# Patient Record
Sex: Male | Born: 1975
Health system: Southern US, Community
[De-identification: ages and names within clinical notes are randomized; demographics above are authoritative.]

## PROBLEM LIST (undated history)

## (undated) DIAGNOSIS — R51 Headache: Secondary | ICD-10-CM

## (undated) DIAGNOSIS — K429 Umbilical hernia without obstruction or gangrene: Secondary | ICD-10-CM

## (undated) DIAGNOSIS — F329 Major depressive disorder, single episode, unspecified: Secondary | ICD-10-CM

## (undated) DIAGNOSIS — Z87442 Personal history of urinary calculi: Secondary | ICD-10-CM

## (undated) DIAGNOSIS — F419 Anxiety disorder, unspecified: Secondary | ICD-10-CM

## (undated) DIAGNOSIS — R519 Headache, unspecified: Secondary | ICD-10-CM

## (undated) DIAGNOSIS — T4145XA Adverse effect of unspecified anesthetic, initial encounter: Secondary | ICD-10-CM

## (undated) DIAGNOSIS — D239 Other benign neoplasm of skin, unspecified: Secondary | ICD-10-CM

## (undated) DIAGNOSIS — K219 Gastro-esophageal reflux disease without esophagitis: Secondary | ICD-10-CM

## (undated) DIAGNOSIS — J189 Pneumonia, unspecified organism: Secondary | ICD-10-CM

## (undated) DIAGNOSIS — I10 Essential (primary) hypertension: Secondary | ICD-10-CM

## (undated) DIAGNOSIS — T8859XA Other complications of anesthesia, initial encounter: Secondary | ICD-10-CM

## (undated) DIAGNOSIS — F32A Depression, unspecified: Secondary | ICD-10-CM

## (undated) HISTORY — DX: Other benign neoplasm of skin, unspecified: D23.9

## (undated) HISTORY — DX: Gastro-esophageal reflux disease without esophagitis: K21.9

## (undated) HISTORY — DX: Anxiety disorder, unspecified: F41.9

## (undated) HISTORY — PX: TONSILLECTOMY: SUR1361

## (undated) HISTORY — PX: HERNIA REPAIR: SHX51

## (undated) HISTORY — DX: Umbilical hernia without obstruction or gangrene: K42.9

## (undated) HISTORY — DX: Essential (primary) hypertension: I10

## (undated) HISTORY — DX: Depression, unspecified: F32.A

## (undated) HISTORY — DX: Major depressive disorder, single episode, unspecified: F32.9

---

## 2007-04-23 ENCOUNTER — Emergency Department: Payer: Self-pay | Admitting: Emergency Medicine

## 2010-06-26 ENCOUNTER — Ambulatory Visit: Payer: Self-pay

## 2010-07-21 ENCOUNTER — Ambulatory Visit: Payer: Self-pay | Admitting: Orthopedic Surgery

## 2013-01-27 ENCOUNTER — Emergency Department: Payer: Self-pay | Admitting: Emergency Medicine

## 2013-01-27 LAB — CBC
HCT: 43.6 % (ref 40.0–52.0)
MCH: 30.6 pg (ref 26.0–34.0)
MCV: 89 fL (ref 80–100)
Platelet: 136 10*3/uL — ABNORMAL LOW (ref 150–440)
RBC: 4.91 10*6/uL (ref 4.40–5.90)
WBC: 9.1 10*3/uL (ref 3.8–10.6)

## 2013-01-27 LAB — COMPREHENSIVE METABOLIC PANEL
Anion Gap: 9 (ref 7–16)
BUN: 16 mg/dL (ref 7–18)
Calcium, Total: 8.1 mg/dL — ABNORMAL LOW (ref 8.5–10.1)
EGFR (Non-African Amer.): >60
Glucose: 128 mg/dL — ABNORMAL HIGH (ref 65–99)
Osmolality: 279 (ref 275–301)
Potassium: 3.4 mmol/L — ABNORMAL LOW (ref 3.5–5.1)
SGOT(AST): 111 U/L — ABNORMAL HIGH (ref 15–37)
SGPT (ALT): 83 U/L — ABNORMAL HIGH (ref 12–78)
Sodium: 138 mmol/L (ref 136–145)
Total Protein: 7.5 g/dL (ref 6.4–8.2)

## 2013-08-14 IMAGING — CT CT ABD-PELV W/ CM
1 of 2 series · 15 of 32 positions shown, 19 images · non-contrast
Comparison: none

REASON FOR EXAM: (1) epigastric pain, vomiting; (2) epigastric pain,
vomiting
COMMENTS:

[Series 2: 3mm soft tissue · axial · 0.79mm/px · z∈[-542,-60]mm · 15 of 177 slices shown, 19 images]
[im 8/177  soft-tissue]
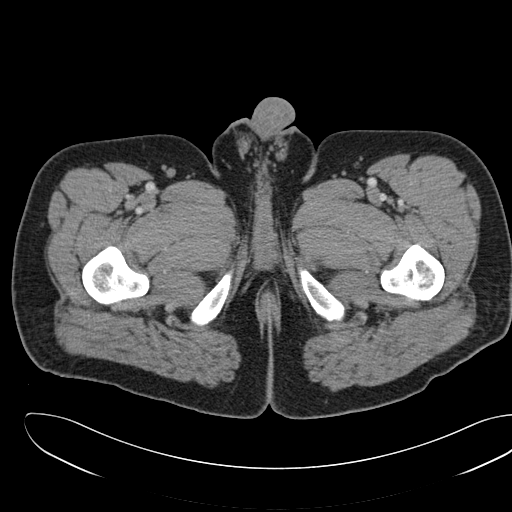
[im 8/177  bone]
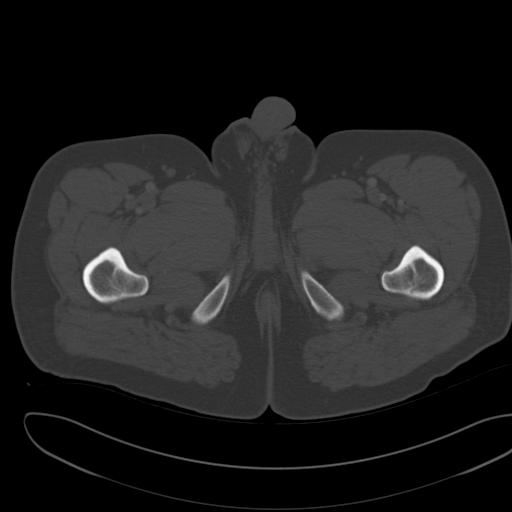
[im 23/177  soft-tissue]
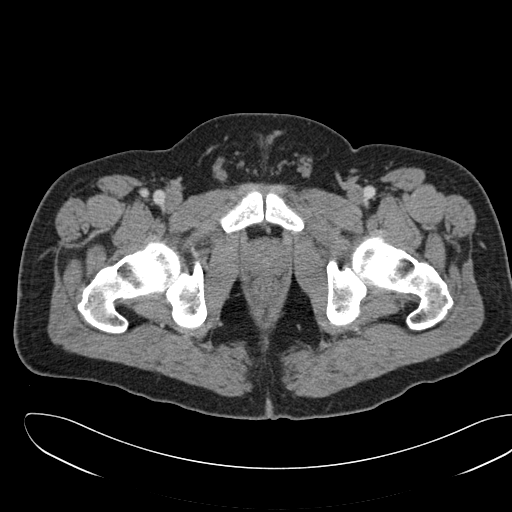
[im 37/177  soft-tissue]
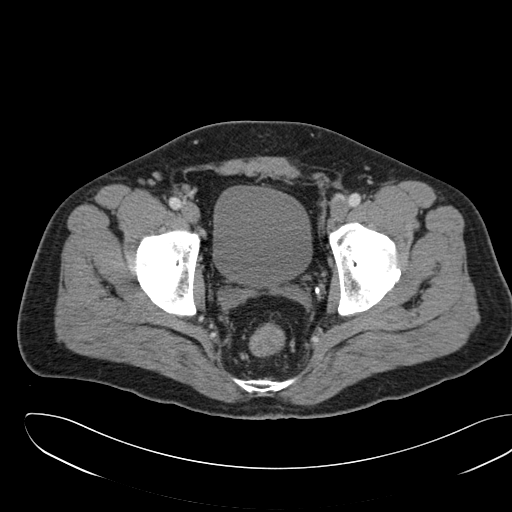
[im 52/177  soft-tissue]
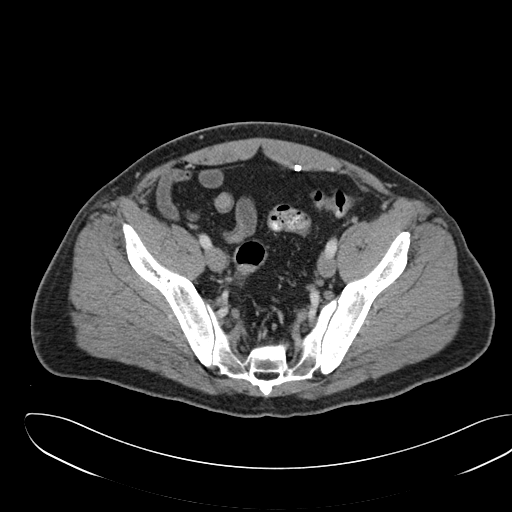
[im 59/177  soft-tissue]
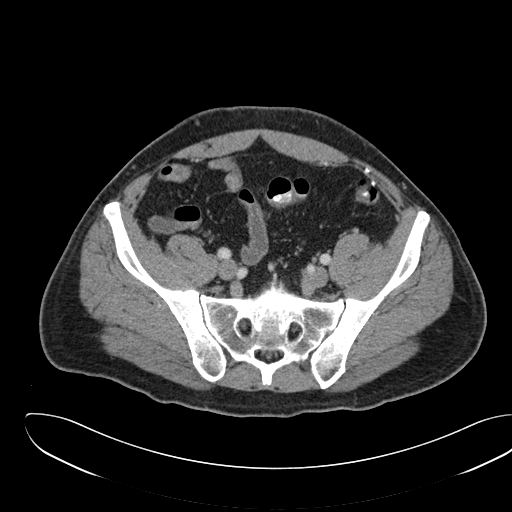
[im 74/177  soft-tissue]
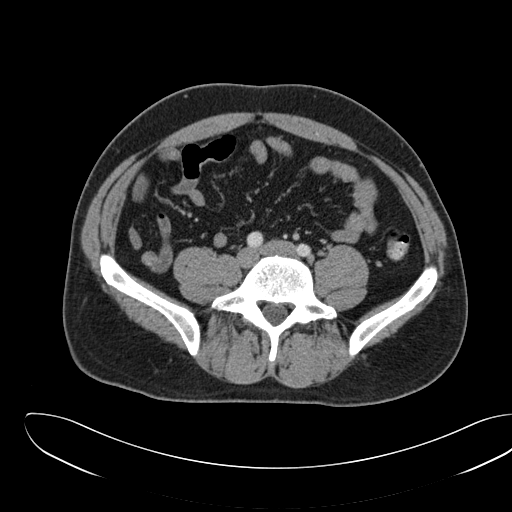
[im 89/177  soft-tissue]
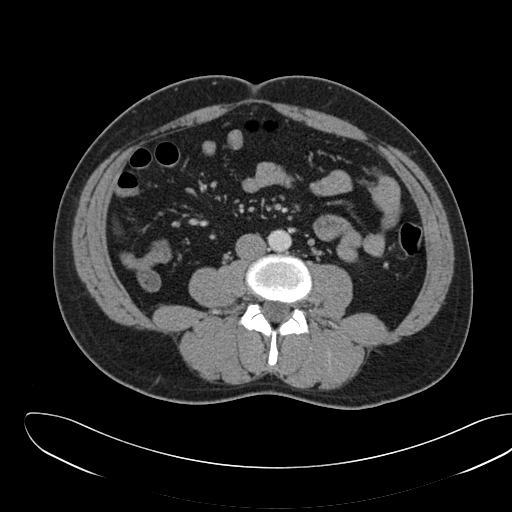
[im 103/177  soft-tissue]
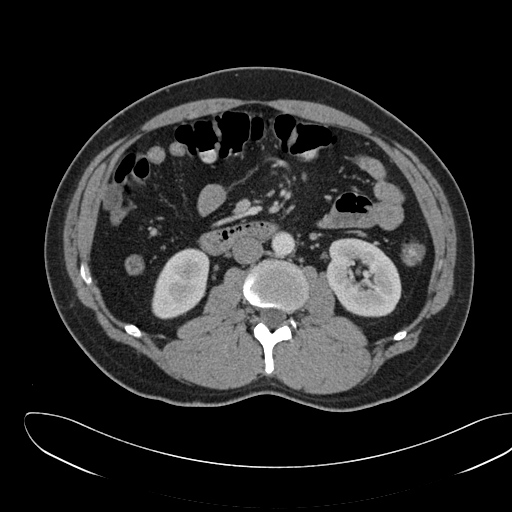
[im 118/177  soft-tissue]
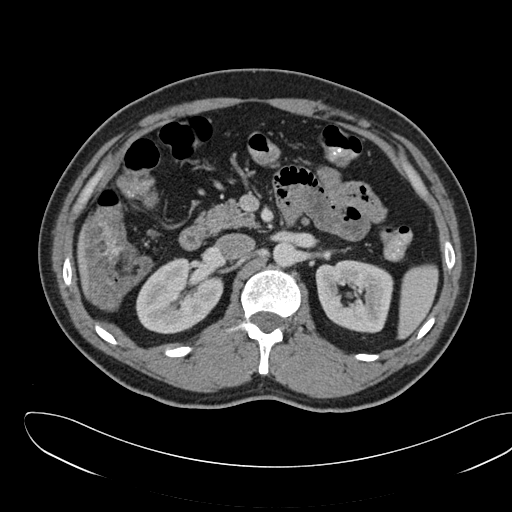
[im 118/177  bone]
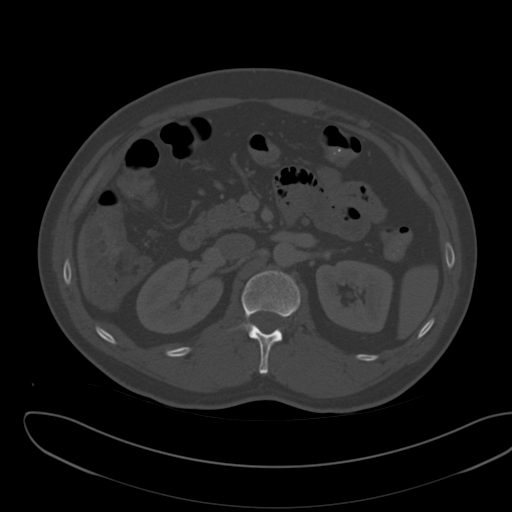
[im 125/177  soft-tissue]
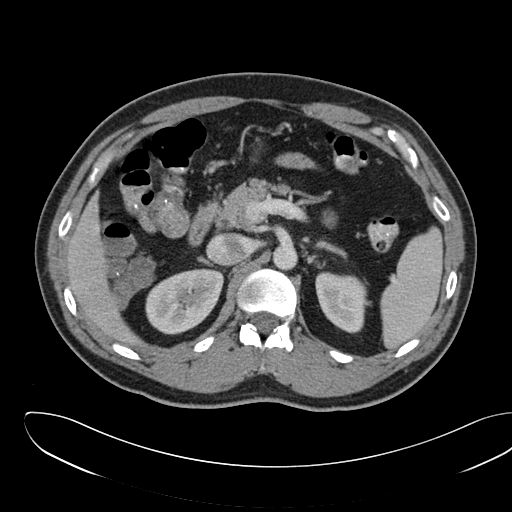
[im 140/177  soft-tissue]
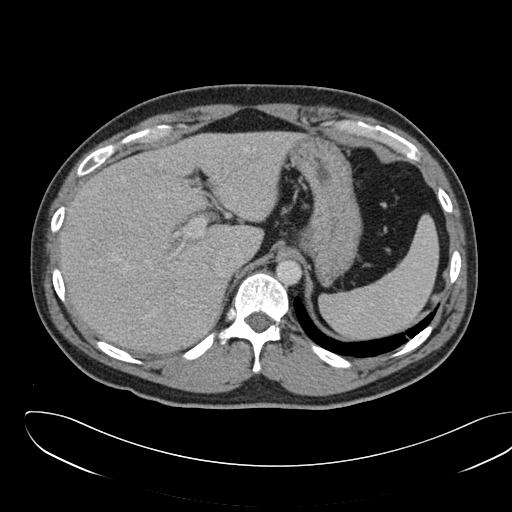
[im 147/177  lung]
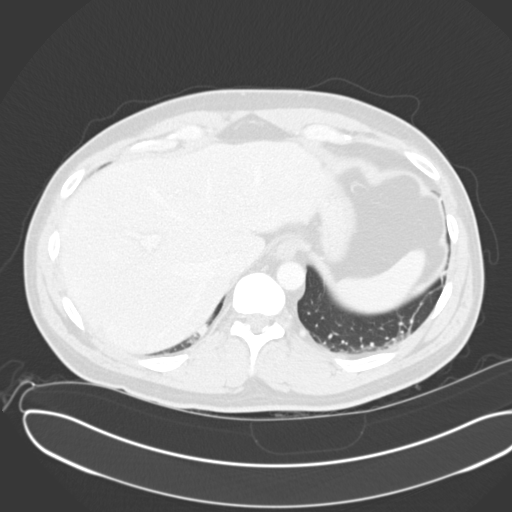
[im 155/177  soft-tissue]
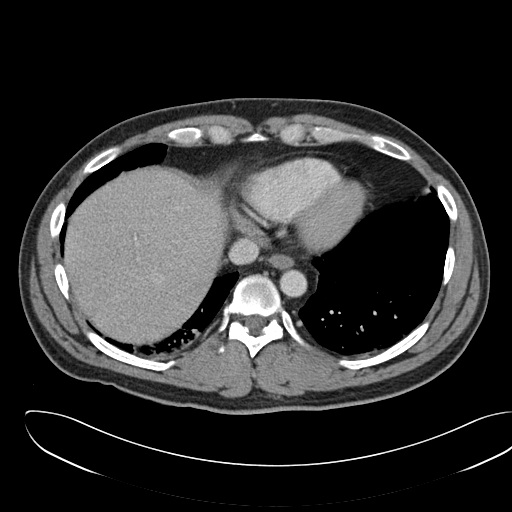
[im 155/177  lung]
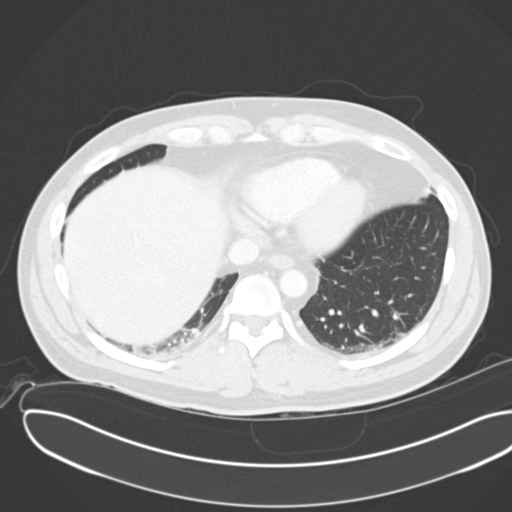
[im 162/177  lung]
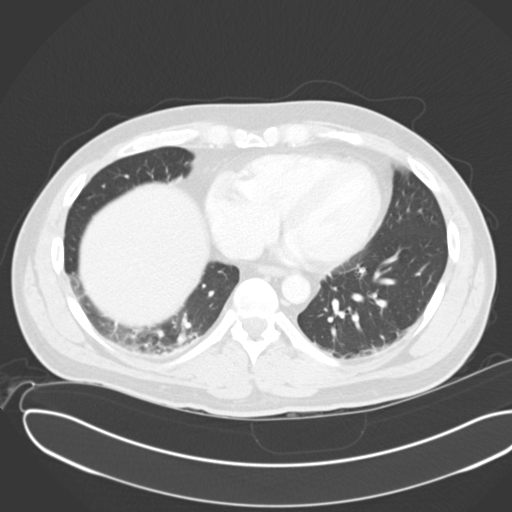
[im 169/177  soft-tissue]
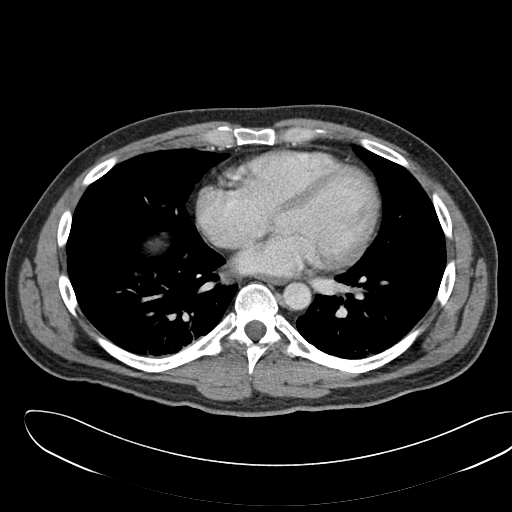
[im 169/177  lung]
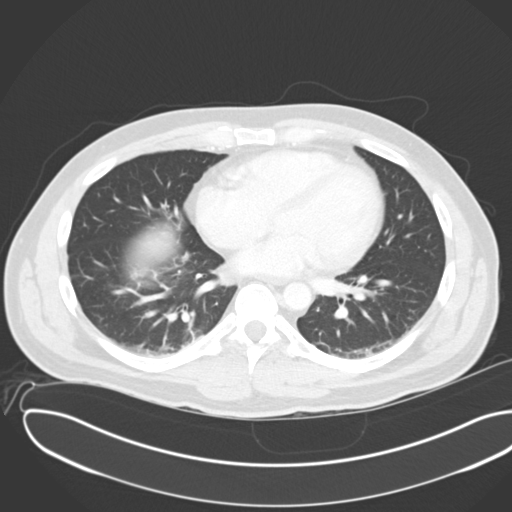

[15 of 32 positions shown; findings below may reference images not displayed]

PROCEDURE:     CT  - CT ABDOMEN / PELVIS  W  - January 28, 2013  [DATE]

RESULT:     Axial CT scanning was performed through the abdomen and pelvis
with reconstructions at 3 mm intervals and slice thicknesses following
intravenous administration of 100 cc of Isovue 300. Review of multiplanar
reconstructed images was performed separately on the VIA monitor.

The liver exhibits no focal mass. There is a trace of intrahepatic ductal
dilation. The gallbladder is contracted. The stomach is nondistended. The
pancreas, spleen, adrenal glands, and kidneys exhibit no acute
abnormalities. There is a 1.5 cm diameter accessory spleen on image 48. The
caliber of the abdominal aorta is normal. The periaortic and pericaval
regions are normal in appearance.

The unopacified loops of small and large bowel exhibit no evidence of ileus
nor obstruction. The right colon is not well distended. No definite
inflammatory changes are seen associated with it. The appendix is not
discretely identified but may lie on image 57. The rectosigmoid colon is
normal in appearance. The urinary bladder and prostate gland and seminal
vesicles are within the limits of normal. There is a small fat containing
right inguinal hernia. The patient has undergone previous left inguinal
hernia repair. There is no evidence of ascites.

There is atelectasis at the lung bases. The lumbar vertebral bodies are
preserved in height.
IMPRESSION: 1. There is no objective evidence of acute bowel abnormality.
2. No acute urinary tract abnormality is demonstrated.
3. The gallbladder is contracted and yet the stomach appeared to be empty.
There is also a trace of intrahepatic ductal dilation. These findings may be
physiologic but correlation with any symptoms that might indicate
gallbladder pathology be useful.
4. There is bibasilar atelectasis.

A preliminary report was sent to the [HOSPITAL] the conclusion
of the study.

The final report was discussed by me by telephone with Desi, RN, [REDACTED] charge nurse at [DATE] a.m. on 28 January, 2013.

[REDACTED]

## 2014-04-19 DIAGNOSIS — D239 Other benign neoplasm of skin, unspecified: Secondary | ICD-10-CM

## 2014-04-19 HISTORY — DX: Other benign neoplasm of skin, unspecified: D23.9

## 2014-07-13 ENCOUNTER — Emergency Department: Payer: Self-pay | Admitting: Emergency Medicine

## 2014-07-18 ENCOUNTER — Emergency Department: Payer: Self-pay | Admitting: Emergency Medicine

## 2015-01-16 ENCOUNTER — Emergency Department: Payer: Self-pay | Admitting: Physician Assistant

## 2015-06-06 ENCOUNTER — Other Ambulatory Visit: Payer: Self-pay | Admitting: Family Medicine

## 2015-08-02 IMAGING — CR DG RIBS 2V*R*
1 series · 3 of 3 positions shown · non-contrast
Comparison: None.

CLINICAL DATA: Cough since November 2014. New onset right upper
chest pain.

EXAM:
RIGHT RIBS - 2 VIEW

[Series 1: dxr ribs right unilateral · 0.14mm/px · 3 of 3 slices shown]
[im 1/3]
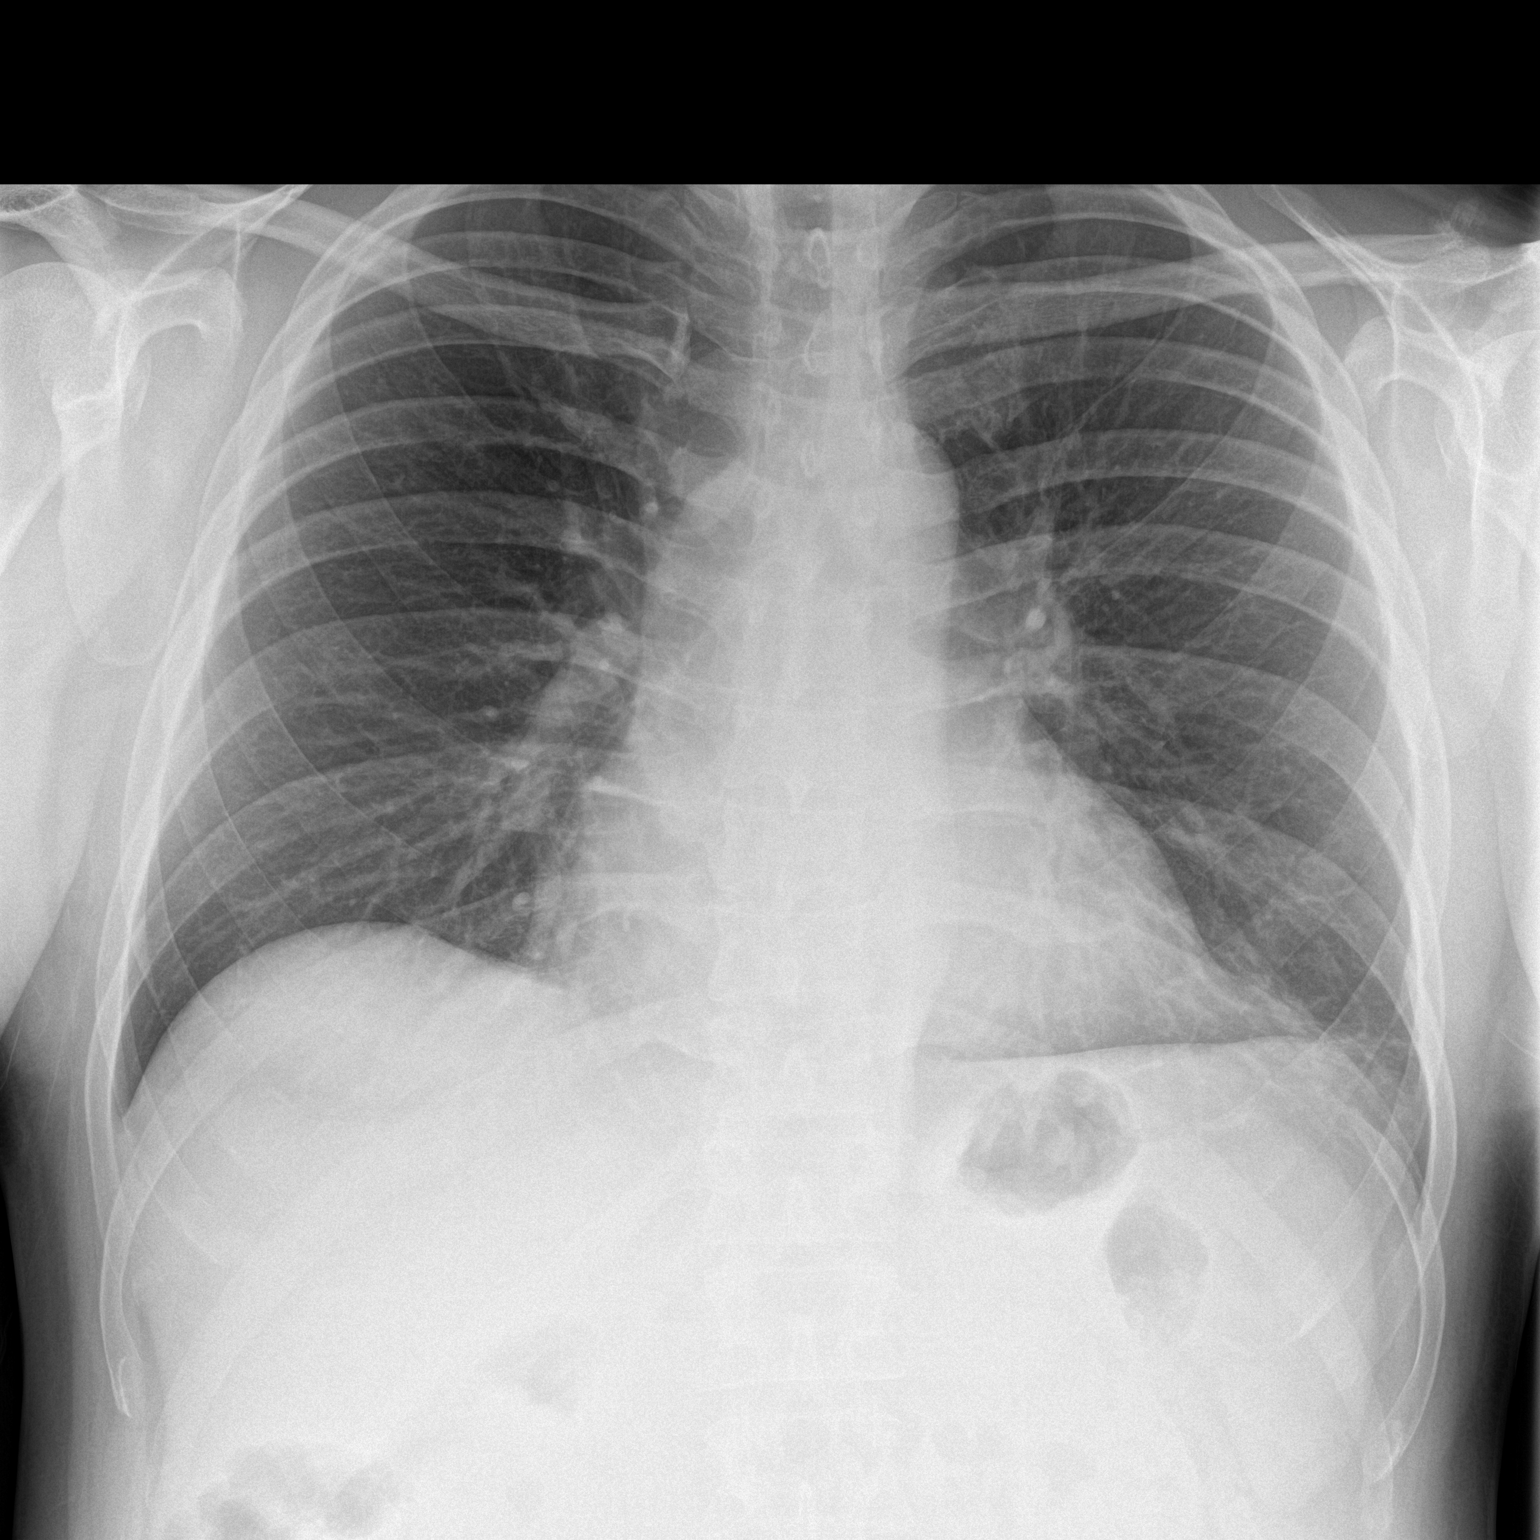
[im 2/3]
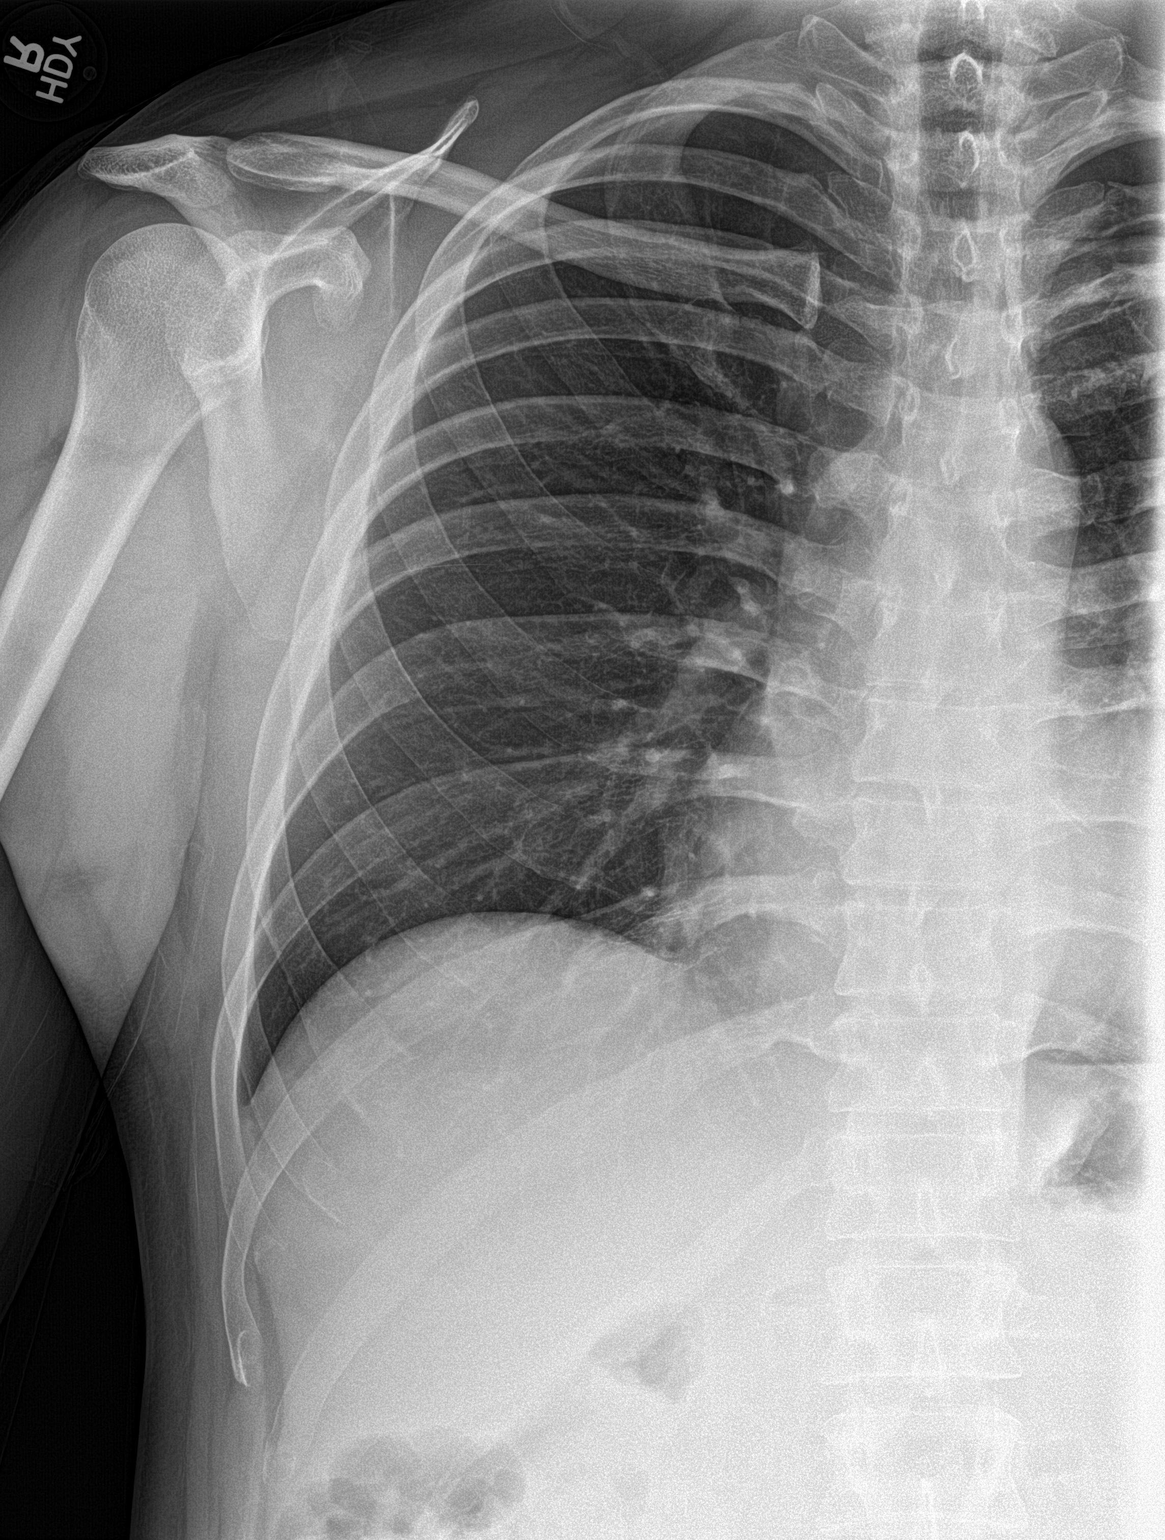
[im 3/3]
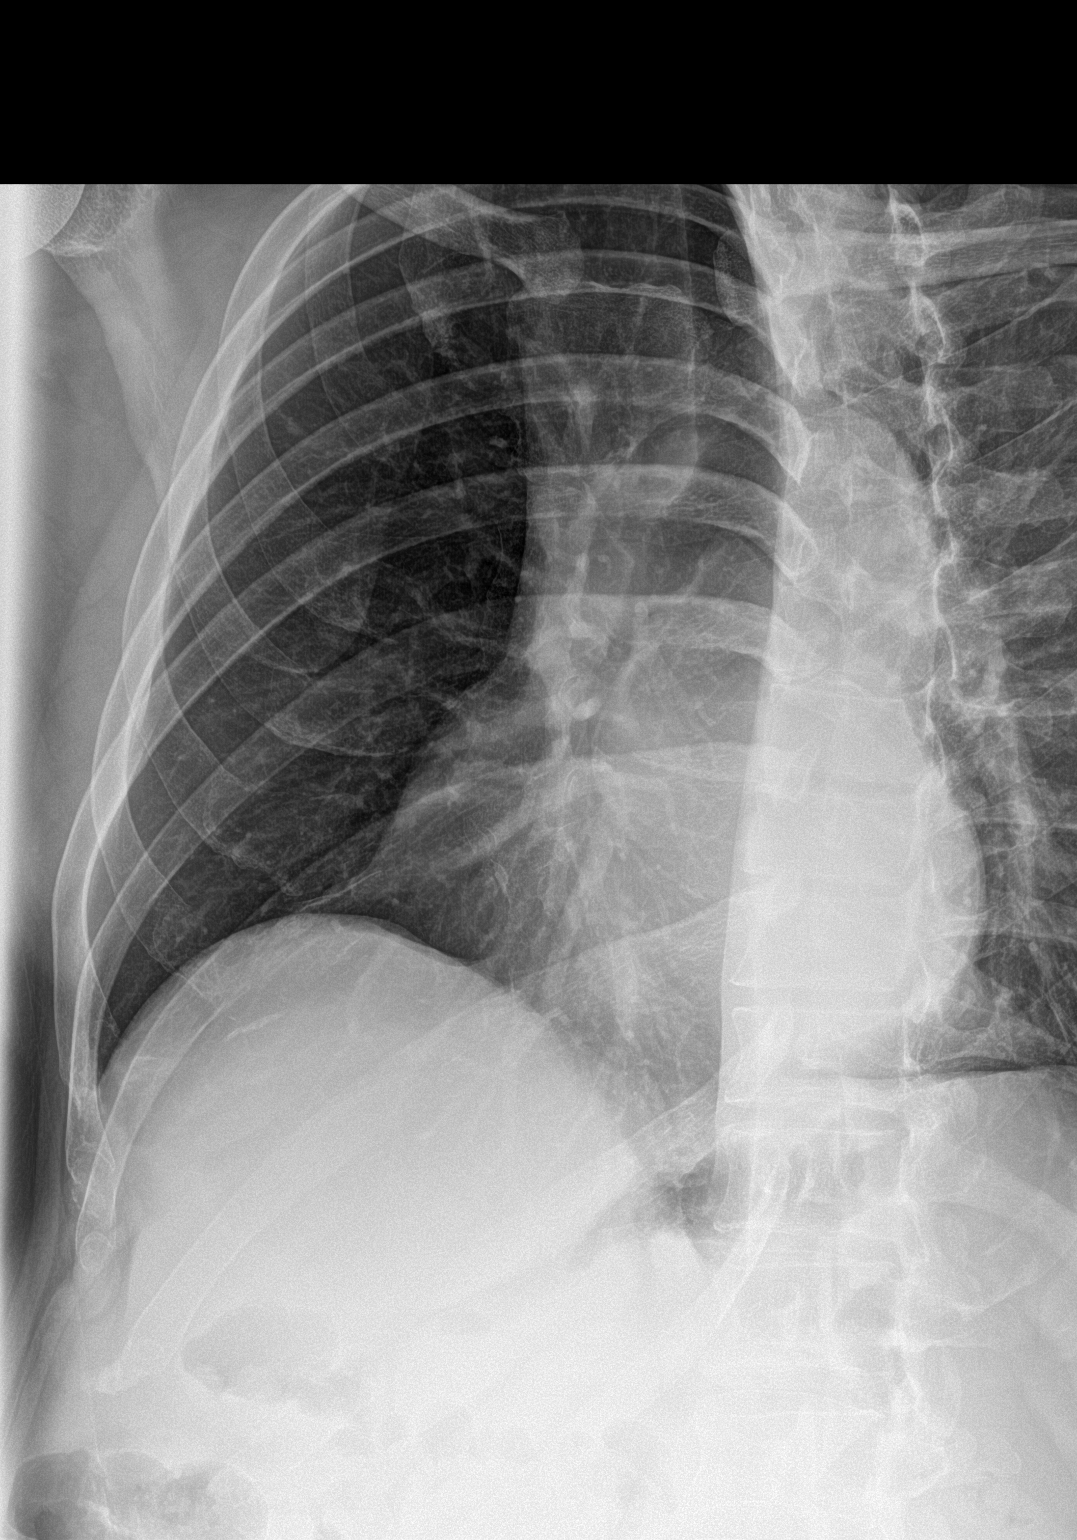

[3 of 3 positions shown; findings below may reference images not displayed]

FINDINGS: Minimal left basilar atelectasis is identified. The lungs are
otherwise clear. There is no pneumothorax or pleural effusion. Heart
size is normal. No fracture is identified.
IMPRESSION: Negative examination.

## 2016-01-22 ENCOUNTER — Ambulatory Visit
Admission: EM | Admit: 2016-01-22 | Discharge: 2016-01-22 | Disposition: A | Discharge status: Home / Self Care | Payer: Managed Care, Other (non HMO) | Attending: Family Medicine | Admitting: Family Medicine

## 2016-01-22 DIAGNOSIS — J029 Acute pharyngitis, unspecified: Secondary | ICD-10-CM

## 2016-01-22 LAB — RAPID STREP SCREEN (MED CTR MEBANE ONLY): STREPTOCOCCUS, GROUP A SCREEN (DIRECT): NEGATIVE

## 2016-01-22 MED ORDER — AMOXICILLIN-POT CLAVULANATE 875-125 MG PO TABS: 1.0000 | ORAL_TABLET | Freq: Two times a day (BID) | ORAL | Status: DC

## 2016-01-22 NOTE — ED Notes (Signed)
Patient complains of sore throat, body aches, sinus pain and pressure with drainage. Patient states that daughter currently has strep. Patient states that he feels very run down and fatigued.

## 2016-01-22 NOTE — ED Provider Notes (Signed)
CSN: [id number]     Arrival date & time 01/22/16  0915 History   First MD Initiated Contact with Patient 01/22/16 1021     Chief Complaint  Patient presents with  . Sore Throat   (Consider location/radiation/quality/duration/timing/severity/associated sxs/prior Treatment) HPI   This a 41 year old male who complains of sore throat and body aches in his pain and pressure with some drainage is had for several days. His throat has gotten more sore recently. Lites that his daughter and son recently were diagnosed with strep. Initially the rapid test was negative on both of them and then later they were notified that it did turn positive. He states he feels very fatigued.  History reviewed. No pertinent past medical history. Past Surgical History  Procedure Laterality Date  . No past surgeries     History reviewed. No pertinent family history. Social History  Substance Use Topics  . Smoking status: Never Smoker   . Smokeless tobacco: None  . Alcohol Use: No    Review of Systems  Constitutional: Positive for activity change and fatigue. Negative for fever and chills.  HENT: Positive for congestion, postnasal drip, rhinorrhea, sinus pressure and sore throat.   All other systems reviewed and are negative.   Allergies  Review of patient's allergies indicates no known allergies.  Home Medications   Prior to Admission medications   Medication Sig Start Date End Date Taking? Authorizing Provider  amoxicillin-clavulanate (AUGMENTIN) 875-125 MG tablet Take 1 tablet by mouth every 12 (twelve) hours. 01/22/16   Lorin Picket, PA-C  fluticasone (FLONASE) 50 MCG/ACT nasal spray INSTILL 2 SPRAYS IN EACH NOSTRIL DAILY 06/07/15   Megan Annia Friendly, DO   Meds Ordered and Administered this Visit  Medications - No data to display  BP 124/84 mmHg  Pulse 74  Temp(Src) 98.2 F (36.8 C) (Oral)  Resp 15  Ht 5' 11"  (1.803 m)  Wt 211 lb (95.709 kg)  BMI 29.44 kg/m2  SpO2 98% No data  found.   Physical Exam  Constitutional: He is oriented to person, place, and time. He appears well-developed and well-nourished. No distress.  HENT:  Head: Normocephalic and atraumatic.  Nose: Nose normal.  Mouth/Throat: Oropharynx is clear and moist. No oropharyngeal exudate.  TMs are dull  Eyes: Conjunctivae are normal. Pupils are equal, round, and reactive to light.  Neck: Normal range of motion. Neck supple. No thyromegaly present.  Pulmonary/Chest: Effort normal and breath sounds normal. No respiratory distress. He has no wheezes. He has no rales.  Musculoskeletal: Normal range of motion. He exhibits no edema or tenderness.  Lymphadenopathy:    He has no cervical adenopathy.  Neurological: He is alert and oriented to person, place, and time.  Skin: Skin is warm and dry. He is not diaphoretic.  Psychiatric: He has a normal mood and affect. His behavior is normal. Judgment and thought content normal.  Nursing note and vitals reviewed.   ED Course  Procedures (including critical care time)  Labs Review Labs Reviewed  RAPID STREP SCREEN (NOT AT [facility])  CULTURE, GROUP A STREP [facility])    Imaging Review No results found.   Visual Acuity Review  Right Eye Distance:   Left Eye Distance:   Bilateral Distance:    Right Eye Near:   Left Eye Near:    Bilateral Near:         MDM   1. Acute pharyngitis, unspecified pharyngitis type    New Prescriptions   AMOXICILLIN-CLAVULANATE (AUGMENTIN) 875-125 MG TABLET  Take 1 tablet by mouth every 12 (twelve) hours.  Plan: 1. Test/x-ray results and diagnosis reviewed with patient 2. rx as per orders; risks, benefits, potential side effects reviewed with patient 3. Recommend supportive treatment with continuous use of Flonase and that he pot. Although his rapid strep was negative he has 2 close contacts at home that had similar symptoms tested negative for the rapid stress and 2 days later converted to positive . For that reason,  I will treat him empirically on some Augmentin. He'll call in 2 days for confirmation of his strep test 4. F/u prn if symptoms worsen or don't improve     Lorin Picket, PA-C 01/22/16 1050

## 2016-01-22 NOTE — Discharge Instructions (Signed)
Pharyngitis Pharyngitis is redness, pain, and swelling (inflammation) of your pharynx.  CAUSES  Pharyngitis is usually caused by infection. Most of the time, these infections are from viruses (viral) and are part of a cold. However, sometimes pharyngitis is caused by bacteria (bacterial). Pharyngitis can also be caused by allergies. Viral pharyngitis may be spread from person to person by coughing, sneezing, and personal items or utensils (cups, forks, spoons, toothbrushes). Bacterial pharyngitis may be spread from person to person by more intimate contact, such as kissing.  SIGNS AND SYMPTOMS  Symptoms of pharyngitis include:   Sore throat.   Tiredness (fatigue).   Low-grade fever.   Headache.  Joint pain and muscle aches.  Skin rashes.  Swollen lymph nodes.  Plaque-like film on throat or tonsils (often seen with bacterial pharyngitis). DIAGNOSIS  Your health care provider will ask you questions about your illness and your symptoms. Your medical history, along with a physical exam, is often all that is needed to diagnose pharyngitis. Sometimes, a rapid strep test is done. Other lab tests may also be done, depending on the suspected cause.  TREATMENT  Viral pharyngitis will usually get better in 3-4 days without the use of medicine. Bacterial pharyngitis is treated with medicines that kill germs (antibiotics).  HOME CARE INSTRUCTIONS   Drink enough water and fluids to keep your urine clear or pale yellow.   Only take over-the-counter or prescription medicines as directed by your health care provider:   If you are prescribed antibiotics, make sure you finish them even if you start to feel better.   Do not take aspirin.   Get lots of rest.   Gargle with 8 oz of salt water ( tsp of salt per 1 qt of water) as often as every 1-2 hours to soothe your throat.   Throat lozenges (if you are not at risk for choking) or sprays may be used to soothe your throat. SEEK MEDICAL  CARE IF:   You have large, tender lumps in your neck.  You have a rash.  You cough up green, yellow-brown, or bloody spit. SEEK IMMEDIATE MEDICAL CARE IF:   Your neck becomes stiff.  You drool or are unable to swallow liquids.  You vomit or are unable to keep medicines or liquids down.  You have severe pain that does not go away with the use of recommended medicines.  You have trouble breathing (not caused by a stuffy nose). MAKE SURE YOU:   Understand these instructions.  Will watch your condition.  Will get help right away if you are not doing well or get worse.   This information is not intended to replace advice given to you by your health care provider. Make sure you discuss any questions you have with your health care provider.   Document Released: 12/17/2005 Document Revised: 10/07/2013 Document Reviewed: 08/24/2013 Elsevier Interactive Patient Education 2016 Elsevier Inc.  Rapid Strep Test Strep throat is a bacterial infection caused by the bacteria Streptococcus pyogenes. A rapid strep test is the quickest way to check if these bacteria are causing your sore throat. The test can be done at your health care provider's office. Results are usually ready in 10-20 minutes. You may have this test if you have symptoms of strep throat. These include:   A red throat with yellow or white spots.  Neck swelling and tenderness.  Fever.  Loss of appetite.  Trouble breathing or swallowing.  Rash.  Dehydration. This test requires a sample of fluid from the  back of your throat and tonsils. Your health care provider may hold down your tongue with a tongue depressor and use a swab to collect the sample.  Your health care provider may collect a second sample at the same time. The second sample may be used for a throat culture. In a culture test, the sample is combined with a substance that encourages bacteria to grow. It takes longer to get the results of the throat culture  test, but they are more accurate. They can confirm the results from a rapid strep test, or show that those results were wrong. RESULTS  It is your responsibility to obtain your test results. Ask the lab or department performing the test when and how you will get your results. Contact your health care provider to discuss any questions you have about your results.  The results of the rapid strep test will be negative or positive.  Meaning of Negative Test Results If the result of your rapid strep test is negative, then it means:   It is likely that you do not have strep throat.  A virus may be causing your sore throat. Your health care provider may do a throat culture to confirm the results of the rapid strep test. The throat culture can also identify the different strains of strep bacteria. Meaning of Positive Test Results If the result of your rapid strep test is positive, then it means:  It is likely that you do have strep throat.  You may have to take antibiotics. Your health care provider may do a throat culture to confirm the results of the rapid strep test. Strep throat usually requires a course of antibiotics.    This information is not intended to replace advice given to you by your health care provider. Make sure you discuss any questions you have with your health care provider.   Document Released: 01/24/2005 Document Revised: 01/07/2015 Document Reviewed: 03/25/2014 Elsevier Interactive Patient Education [employer].

## 2016-01-24 LAB — CULTURE, GROUP A STREP (THRC)

## 2016-03-07 ENCOUNTER — Encounter: Payer: Self-pay | Admitting: Family Medicine

## 2016-03-07 ENCOUNTER — Ambulatory Visit (INDEPENDENT_AMBULATORY_CARE_PROVIDER_SITE_OTHER): Payer: Managed Care, Other (non HMO) | Admitting: Family Medicine

## 2016-03-07 DIAGNOSIS — Z113 Encounter for screening for infections with a predominantly sexual mode of transmission: Secondary | ICD-10-CM | POA: Diagnosis not present

## 2016-03-07 DIAGNOSIS — F329 Major depressive disorder, single episode, unspecified: Secondary | ICD-10-CM | POA: Insufficient documentation

## 2016-03-07 DIAGNOSIS — F32A Depression, unspecified: Secondary | ICD-10-CM

## 2016-03-07 DIAGNOSIS — Z0000 Encounter for general adult medical examination without abnormal findings: Secondary | ICD-10-CM | POA: Diagnosis not present

## 2016-03-07 LAB — URINALYSIS, ROUTINE W REFLEX MICROSCOPIC
Bilirubin, UA: NEGATIVE
Glucose, UA: NEGATIVE
Ketones, UA: NEGATIVE
LEUKOCYTES UA: NEGATIVE
NITRITE UA: NEGATIVE
PH UA: 5.5 (ref 5.0–7.5)
PROTEIN UA: NEGATIVE
RBC, UA: NEGATIVE
SPEC GRAV UA: 1.02 (ref 1.005–1.030)
Urobilinogen, Ur: 0.2 mg/dL (ref 0.2–1.0)

## 2016-03-07 MED ORDER — RABEPRAZOLE SODIUM 20 MG PO TBEC: 20.0000 mg | DELAYED_RELEASE_TABLET | Freq: Every day | ORAL | Status: DC

## 2016-03-07 NOTE — Progress Notes (Signed)
   There were no vitals taken for this visit.   Subjective:    Patient ID: Martin Parks, male    DOB: [birth year], 41 y.o.   MRN: [id number]  HPI: Martin Parks is a 41 y.o. male  Chief Complaint  Patient presents with  . Annual Exam   patient follow-up doing well no complaints from medications taking Flonase without problems allergies doing well Fluoxetine doing good for nerves takes Xanax 1 or 2 a month for stormy days and otherwise doing well.   Relevant past medical, surgical, family and social history reviewed and updated as indicated. Interim medical history since our last visit reviewed. Allergies and medications reviewed and updated.  Review of Systems  Constitutional: Negative.   HENT: Negative.   Eyes: Negative.   Respiratory: Negative.   Cardiovascular: Negative.   Gastrointestinal: Negative.   Endocrine: Negative.   Genitourinary: Negative.   Musculoskeletal: Negative.   Skin: Negative.   Allergic/Immunologic: Negative.   Neurological: Negative.   Hematological: Negative.   Psychiatric/Behavioral: Negative.     Per HPI unless specifically indicated above     Objective:    There were no vitals taken for this visit.  Wt Readings from Last 3 Encounters:  01/27/15 209 lb (94.802 kg)  01/22/16 211 lb (95.709 kg)    Physical Exam  Constitutional: He is oriented to person, place, and time. He appears well-developed and well-nourished.  HENT:  Head: Normocephalic and atraumatic.  Right Ear: External ear normal.  Left Ear: External ear normal.  Eyes: Conjunctivae and EOM are normal. Pupils are equal, round, and reactive to light.  Neck: Normal range of motion. Neck supple.  Cardiovascular: Normal rate, regular rhythm, normal heart sounds and intact distal pulses.   Pulmonary/Chest: Effort normal and breath sounds normal.  Abdominal: Soft. Bowel sounds are normal. There is no splenomegaly or hepatomegaly.  Genitourinary: Rectum normal, prostate normal  and penis normal.  Musculoskeletal: Normal range of motion.  Neurological: He is alert and oriented to person, place, and time. He has normal reflexes.  Skin: No rash noted. No erythema.  Psychiatric: He has a normal mood and affect. His behavior is normal. Judgment and thought content normal.        Assessment & Plan:   Problem List Items Addressed This Visit      Other   Depression    Depression anxiety stable discuss again limitations of Xanax and cautions      Relevant Medications   ALPRAZolam (XANAX) 0.5 MG tablet    Other Visit Diagnoses    Routine general medical examination at a health care facility    -  Primary    Relevant Orders    CBC with Differential/Platelet    Comprehensive metabolic panel    Lipid Panel w/o Chol/HDL Ratio    PSA    Urinalysis, Routine w reflex microscopic (not at [facility])    TSH    Routine screening for STI (sexually transmitted infection)        Relevant Orders    HIV antibody        Follow up plan: Return in about 6 months (around 09/07/2016) for Med check.

## 2016-03-07 NOTE — Assessment & Plan Note (Signed)
Depression anxiety stable discuss again limitations of Xanax and cautions

## 2016-03-08 ENCOUNTER — Encounter: Payer: Self-pay | Admitting: Family Medicine

## 2016-03-08 LAB — COMPREHENSIVE METABOLIC PANEL
ALBUMIN: 4.3 g/dL (ref 3.5–5.5)
ALT: 33 IU/L (ref 0–44)
AST: 16 IU/L (ref 0–40)
Albumin/Globulin Ratio: 1.7 (ref 1.1–2.5)
Alkaline Phosphatase: 77 IU/L (ref 39–117)
BUN / CREAT RATIO: 16 (ref 9–20)
BUN: 12 mg/dL (ref 6–24)
Bilirubin Total: 0.2 mg/dL (ref 0.0–1.2)
CALCIUM: 8.9 mg/dL (ref 8.7–10.2)
CO2: 23 mmol/L (ref 18–29)
CREATININE: 0.76 mg/dL (ref 0.76–1.27)
Chloride: 101 mmol/L (ref 96–106)
GFR, EST AFRICAN AMERICAN: 132 mL/min/{1.73_m2} (ref >59)
GFR, EST NON AFRICAN AMERICAN: 114 mL/min/{1.73_m2} (ref >59)
GLUCOSE: 80 mg/dL (ref 65–99)
Globulin, Total: 2.5 g/dL (ref 1.5–4.5)
Potassium: 3.8 mmol/L (ref 3.5–5.2)
Sodium: 140 mmol/L (ref 134–144)
TOTAL PROTEIN: 6.8 g/dL (ref 6.0–8.5)

## 2016-03-08 LAB — LIPID PANEL W/O CHOL/HDL RATIO
Cholesterol, Total: 226 mg/dL — ABNORMAL HIGH (ref 100–199)
HDL: 36 mg/dL — AB (ref >39)
LDL CALC: 119 mg/dL — AB (ref 0–99)
Triglycerides: 353 mg/dL — ABNORMAL HIGH (ref 0–149)
VLDL CHOLESTEROL CAL: 71 mg/dL — AB (ref 5–40)

## 2016-03-08 LAB — CBC WITH DIFFERENTIAL/PLATELET
BASOS ABS: 0 10*3/uL (ref 0.0–0.2)
Basos: 1 %
EOS (ABSOLUTE): 0.1 10*3/uL (ref 0.0–0.4)
EOS: 3 %
HEMOGLOBIN: 14.1 g/dL (ref 12.6–17.7)
Hematocrit: 41.9 % (ref 37.5–51.0)
IMMATURE GRANS (ABS): 0 10*3/uL (ref 0.0–0.1)
IMMATURE GRANULOCYTES: 0 %
Lymphocytes Absolute: 2 10*3/uL (ref 0.7–3.1)
Lymphs: 37 %
MCH: 30.3 pg (ref 26.6–33.0)
MCHC: 33.7 g/dL (ref 31.5–35.7)
MCV: 90 fL (ref 79–97)
MONOCYTES: 9 %
Monocytes Absolute: 0.5 10*3/uL (ref 0.1–0.9)
NEUTROS ABS: 2.8 10*3/uL (ref 1.4–7.0)
NEUTROS PCT: 50 %
Platelets: 191 10*3/uL (ref 150–379)
RBC: 4.65 x10E6/uL (ref 4.14–5.80)
RDW: 13.7 % (ref 12.3–15.4)
WBC: 5.5 10*3/uL (ref 3.4–10.8)

## 2016-03-08 LAB — PSA: Prostate Specific Ag, Serum: 0.3 ng/mL (ref 0.0–4.0)

## 2016-03-08 LAB — HIV ANTIBODY (ROUTINE TESTING W REFLEX): HIV SCREEN 4TH GENERATION: NONREACTIVE

## 2016-03-08 LAB — TSH: TSH: 1.68 u[IU]/mL (ref 0.450–4.500)

## 2016-03-08 NOTE — Addendum Note (Signed)
Addended by: Rowe Clack H on: 03/08/2016 01:14 PM   Modules accepted: Miquel Dunn

## 2016-06-14 ENCOUNTER — Other Ambulatory Visit: Payer: Self-pay | Admitting: Family Medicine

## 2017-04-21 ENCOUNTER — Emergency Department
Admission: EM | Admit: 2017-04-21 | Discharge: 2017-04-21 | Disposition: A | Discharge status: Home / Self Care | Payer: Commercial Managed Care - PPO | Attending: Emergency Medicine | Admitting: Emergency Medicine

## 2017-04-21 DIAGNOSIS — Y939 Activity, unspecified: Secondary | ICD-10-CM | POA: Insufficient documentation

## 2017-04-21 DIAGNOSIS — S60351A Superficial foreign body of right thumb, initial encounter: Secondary | ICD-10-CM | POA: Insufficient documentation

## 2017-04-21 DIAGNOSIS — Z79899 Other long term (current) drug therapy: Secondary | ICD-10-CM | POA: Diagnosis not present

## 2017-04-21 DIAGNOSIS — I10 Essential (primary) hypertension: Secondary | ICD-10-CM | POA: Insufficient documentation

## 2017-04-21 DIAGNOSIS — Y929 Unspecified place or not applicable: Secondary | ICD-10-CM | POA: Insufficient documentation

## 2017-04-21 DIAGNOSIS — W450XXA Nail entering through skin, initial encounter: Secondary | ICD-10-CM | POA: Insufficient documentation

## 2017-04-21 DIAGNOSIS — Y999 Unspecified external cause status: Secondary | ICD-10-CM | POA: Insufficient documentation

## 2017-04-21 DIAGNOSIS — S6991XA Unspecified injury of right wrist, hand and finger(s), initial encounter: Secondary | ICD-10-CM | POA: Diagnosis present

## 2017-04-21 MED ORDER — LIDOCAINE-EPINEPHRINE-TETRACAINE (LET) SOLUTION: 3.0000 mL | Freq: Once | NASAL | Status: DC

## 2017-04-21 MED ORDER — CEPHALEXIN 500 MG PO CAPS: 500.0000 mg | ORAL_CAPSULE | Freq: Four times a day (QID) | ORAL | 0 refills | Status: AC

## 2017-04-21 MED ORDER — LIDOCAINE HCL (PF) 1 % IJ SOLN
5.0000 mL | Freq: Once | INTRAMUSCULAR | Status: AC
  Administered 2017-04-21: 5 mL via INTRADERMAL

## 2017-04-21 NOTE — ED Notes (Signed)
See triage note   States he is concerned of infection to thumb  Was unable to remove F B himself

## 2017-04-21 NOTE — ED Provider Notes (Signed)
[facility] Emergency Department Provider Note  ____________________________________________  Time seen: Approximately 11:26 AM  I have reviewed the triage vital signs and the nursing notes.   HISTORY  Chief Complaint Right Thumb Pain    HPI Martin Parks is a 42 y.o. male that presents to the emergency department with a "greenbrier" under right thumbnail. Patient states that he got a thorn caught under his thumbnail and has tried to get it out himself but has not been able to. He is going to [place] for work next week and is concerned that he will get an infection. He denies fever, shortness of breath, chest pain, nausea, vomiting, abdominal pain, drainage from nail.   Past Medical History:  Diagnosis Date  . Anxiety   . Depression   . GERD (gastroesophageal reflux disease)   . Hypertension     Patient Active Problem List   Diagnosis Date Noted  . Depression 03/07/2016    Past Surgical History:  Procedure Laterality Date  . HERNIA REPAIR    . NO PAST SURGERIES    . TONSILLECTOMY      Prior to Admission medications   Medication Sig Start Date End Date Taking? Authorizing Provider  ALPRAZolam Duanne Moron) 0.5 MG tablet 0.5 mg daily as needed. 02/02/16   Historical Provider, MD  cephALEXin (KEFLEX) 500 MG capsule Take 1 capsule (500 mg total) by mouth 4 (four) times daily. 04/21/17 05/01/17  Laban Emperor, PA-C  FLUoxetine (PROZAC) 20 MG capsule TK 3 CS PO QD 01/18/16   Historical Provider, MD  fluticasone (FLONASE) 50 MCG/ACT nasal spray SHAKE WELL AND USE 2 SPRAYS IN EACH NOSTRIL DAILY 06/14/16   Megan P Johnson, DO  RABEprazole (ACIPHEX) 20 MG tablet Take 1 tablet (20 mg total) by mouth daily. 03/07/16   Guadalupe Maple, MD    Allergies Patient has no known allergies.  Family History  Problem Relation Age of Onset  . Cancer Sister     breast    Social History Social History  Substance Use Topics  . Smoking status: Never Smoker  . Smokeless  tobacco: Never Used  . Alcohol use 0.0 oz/week     Comment: occasional     Review of Systems  Constitutional: No fever/chills ENT: No upper respiratory complaints. Cardiovascular: No chest pain. Respiratory: No cough. No SOB. Gastrointestinal: No abdominal pain.  No nausea, no vomiting.  Musculoskeletal: Positive for thumb pain. Skin: Negative for rash, abrasions, lacerations, ecchymosis. Neurological: Negative for headaches, numbness or tingling   ____________________________________________   PHYSICAL EXAM:  VITAL SIGNS: ED Triage Vitals  Enc Vitals Group     BP 04/21/17 1013 (!) 153/103     Pulse Rate 04/21/17 1013 66     Resp 04/21/17 1013 18     Temp 04/21/17 1013 98 F (36.7 C)     Temp Source 04/21/17 1013 Oral     SpO2 04/21/17 1013 97 %     Weight 04/21/17 1013 220 lb (99.8 kg)     Height 04/21/17 1013 5' 11"  (1.803 m)     Head Circumference --      Peak Flow --      Pain Score 04/21/17 1009 5     Pain Loc --      Pain Edu? --      Excl. in [place]? --      Constitutional: Alert and oriented. Well appearing and in no acute distress. Eyes: Conjunctivae are normal. PERRL. EOMI. Head: Atraumatic. ENT:  Ears:      Nose: No congestion/rhinnorhea.      Mouth/Throat: Mucous membranes are moist.  Neck: No stridor.  Cardiovascular: Normal rate, regular rhythm.  Good peripheral circulation. Respiratory: Normal respiratory effort without tachypnea or retractions. Lungs CTAB. Good air entry to the bases with no decreased or absent breath sounds. Musculoskeletal: Full range of motion to all extremities. No gross deformities appreciated. Neurologic:  Normal speech and language. No gross focal neurologic deficits are appreciated.  Skin:  Skin is warm, dry and intact. No rash noted. 25m thorn under right thumbnail near cuticle. Finger tender to palpation. No erythema. No swelling.    ____________________________________________   LABS (all labs ordered are  listed, but only abnormal results are displayed)  Labs Reviewed - No data to display ____________________________________________  EKG   ____________________________________________  RADIOLOGY  No results found.  ____________________________________________    PROCEDURES  Procedure(s) performed:    Procedures  Hole was drilled in right thumbnail with 18-gauge needle. We attempted to numb the cuticle with topical lidocaine but patient requested that the entire finger be numbed with lidocaine. Thorn was removed from nail.   Medications  lidocaine (PF) (XYLOCAINE) 1 % injection 5 mL (5 mLs Intradermal Given by Other 04/21/17 1206)     ____________________________________________   INITIAL IMPRESSION / ASSESSMENT AND PLAN / ED COURSE  Pertinent labs & imaging results that were available during my care of the patient were reviewed by me and considered in my medical decision making (see chart for details).  Review of the [place] CSRS was performed in [place] of the [place] any [place] drugs.     Patient's diagnosis is consistent with foreign body under fingernail. Vital signs and exam are reassuring. Foreign body was removed by drilling a hole in the fingernail with 18-gauge needle. I do not anticipate infection but [place] will be given a prescription for Keflex that he will not start unless he shows signs of infection. Patient will be discharged home with prescriptions for Keflex. Patient is to follow up with PCP as directed. Patient is given ED precautions to return to the ED for any worsening or new symptoms.     ____________________________________________  FINAL CLINICAL IMPRESSION(S) / ED DIAGNOSES  Final diagnoses:  Injury by nail, initial encounter      NEW MEDICATIONS STARTED DURING THIS VISIT:  Discharge Medication List as of 04/21/2017 11:59 AM    START taking these medications   Details   cephALEXin (KEFLEX) 500 MG capsule Take 1 capsule (500 mg total) by mouth 4 (four) times daily., Starting Sun 04/21/2017, Until Wed 05/01/2017, Print            This chart was dictated using voice recognition software/Dragon. Despite best efforts to proofread, errors can occur which can change the meaning. Any change was purely unintentional.    ALaban Emperor PA-C 04/21/17 [place] MD 04/21/17 [id number]

## 2017-04-21 NOTE — ED Triage Notes (Signed)
Pt states he got a greenbrier in his right thumb and pain is worsening since last night.  Pt states he is going to [place] soon and would like it removed before he goes.

## 2018-09-25 LAB — TSH: TSH: 1.65 (ref 0.41–5.90)

## 2018-09-25 LAB — HEPATIC FUNCTION PANEL
ALT: 35 (ref 10–40)
AST: 19 (ref 14–40)

## 2018-09-25 LAB — CBC AND DIFFERENTIAL
HCT: 43 (ref 41–53)
Hemoglobin: 14.6 (ref 13.5–17.5)
WBC: 5.3

## 2018-09-25 LAB — LIPID PANEL
Cholesterol: 212 — AB (ref 0–200)
HDL: 45 (ref 35–70)
LDL Cholesterol: 144
LDl/HDL Ratio: 4.7
Triglycerides: 116 (ref 40–160)

## 2018-09-25 LAB — BASIC METABOLIC PANEL
BUN: 11 (ref 4–21)
Creatinine: 0.8 (ref 0.6–1.3)
Glucose: 98
Potassium: 4.2 (ref 3.4–5.3)
Sodium: 142 (ref 137–147)

## 2018-09-25 LAB — IRON,TIBC AND FERRITIN PANEL: Iron: 112

## 2018-09-25 LAB — PSA: PSA: 0.4

## 2018-12-22 ENCOUNTER — Ambulatory Visit: Payer: Managed Care, Other (non HMO) | Admitting: Family Medicine

## 2018-12-22 ENCOUNTER — Encounter: Payer: Self-pay | Admitting: Family Medicine

## 2018-12-22 VITALS — BP 134/82 | HR 89 | Temp 98.3°F | Resp 16 | Ht 70.25 in | Wt 232.3 lb

## 2018-12-22 DIAGNOSIS — F909 Attention-deficit hyperactivity disorder, unspecified type: Secondary | ICD-10-CM | POA: Insufficient documentation

## 2018-12-22 DIAGNOSIS — F411 Generalized anxiety disorder: Secondary | ICD-10-CM

## 2018-12-22 DIAGNOSIS — E669 Obesity, unspecified: Secondary | ICD-10-CM

## 2018-12-22 DIAGNOSIS — K429 Umbilical hernia without obstruction or gangrene: Secondary | ICD-10-CM

## 2018-12-22 DIAGNOSIS — R635 Abnormal weight gain: Secondary | ICD-10-CM | POA: Diagnosis not present

## 2018-12-22 DIAGNOSIS — E785 Hyperlipidemia, unspecified: Secondary | ICD-10-CM

## 2018-12-22 DIAGNOSIS — Z0000 Encounter for general adult medical examination without abnormal findings: Secondary | ICD-10-CM | POA: Diagnosis not present

## 2018-12-22 DIAGNOSIS — Z3009 Encounter for other general counseling and advice on contraception: Secondary | ICD-10-CM

## 2018-12-22 HISTORY — DX: Umbilical hernia without obstruction or gangrene: K42.9

## 2018-12-22 NOTE — Assessment & Plan Note (Signed)
Encouraged 64 ounces of water a day; limit sugary drinks; try to gradually increase activity

## 2018-12-22 NOTE — Assessment & Plan Note (Signed)
No signs of obstruction; refer to surgeon; in the meantime, to ER if s/s of gangrene, obstruction

## 2018-12-22 NOTE — Patient Instructions (Addendum)
Check out the information at familydoctor.org entitled "Nutrition for Weight Loss: What You Need to Know about Fad Diets" Try to lose between 1-2 pounds per week by taking in fewer calories and burning off more calories You can succeed by limiting portions, limiting foods dense in calories and fat, becoming more active, and drinking 8 glasses of water a day (64 ounces) Don't skip meals, especially breakfast, as skipping meals may alter your metabolism Do not use over-the-counter weight loss pills or gimmicks that claim rapid weight loss A healthy BMI (or body mass index) is between 18.5 and 24.9 You can calculate your ideal BMI at the [place] website ClubMonetize.fr  Try to follow the DASH guidelines (DASH stands for Dietary Approaches to Stop Hypertension). Try to limit the sodium in your diet to no more than 1,534m of sodium per day. Certainly try to not exceed 2,000 mg per day at the very most. Do not add salt when cooking or at the table.  Check the sodium amount on labels when shopping, and choose items lower in sodium when given a choice. Avoid or limit foods that already contain a lot of sodium. Eat a diet rich in fruits and vegetables and whole grains, and try to lose weight if overweight or obese   Obesity, Adult Obesity is the condition of having too much total body fat. Being overweight or obese means that your weight is greater than what is considered healthy for your body size. Obesity is determined by a measurement called BMI. BMI is an estimate of body fat and is calculated from height and weight. For adults, a BMI of 30 or higher is considered obese. Obesity can eventually lead to other health concerns and major illnesses, including:  Stroke.  Coronary artery disease (CAD).  Type 2 diabetes.  Some types of cancer, including cancers of the colon, breast, uterus, and gallbladder.  Osteoarthritis.  High blood pressure  (hypertension).  High cholesterol.  Sleep apnea.  Gallbladder stones.  Infertility problems. What are the causes? The main cause of obesity is taking in (consuming) more calories than your body uses for energy. Other factors that contribute to this condition may include:  Being born with genes that make you more likely to become obese.  Having a medical condition that causes obesity. These conditions include: ? Hypothyroidism. ? Polycystic ovarian syndrome (PCOS). ? Binge-eating disorder. ? Cushing syndrome.  Taking certain medicines, such as steroids, antidepressants, and seizure medicines.  Not being physically active (sedentary lifestyle).  Living where there are limited places to exercise safely or buy healthy foods.  Not getting enough sleep. What increases the risk? The following factors may increase your risk of this condition:  Having a family history of obesity.  Being a woman of African-American descent.  Being a man of Hispanic descent. What are the signs or symptoms? Having excessive body fat is the main symptom of this condition. How is this diagnosed? This condition may be diagnosed based on:  Your symptoms.  Your medical history.  A physical exam. Your health care provider may measure: ? Your BMI. If you are an adult with a BMI between 25 and less than 30, you are considered overweight. If you are an adult with a BMI of 30 or higher, you are considered obese. ? The distances around your hips and your waist (circumferences). These may be compared to each other to help diagnose your condition. ? Your skinfold thickness. Your health care provider may gently pinch a fold of your skin  and measure it. How is this treated? Treatment for this condition often includes changing your lifestyle. Treatment may include some or all of the following:  Dietary changes. Work with your health care provider and a dietitian to set a weight-loss goal that is healthy and  reasonable for you. Dietary changes may include eating: ? Smaller portions. A portion size is the amount of a particular food that is healthy for you to eat at one time. This varies from person to person. ? Low-calorie or low-fat options. ? More whole grains, fruits, and vegetables.  Regular physical activity. This may include aerobic activity (cardio) and strength training.  Medicine to help you lose weight. Your health care provider may prescribe medicine if you are unable to lose 1 pound a week after 6 weeks of eating more healthily and doing more physical activity.  Surgery. Surgical options may include gastric banding and gastric bypass. Surgery may be done if: ? Other treatments have not helped to improve your condition. ? You have a BMI of 40 or higher. ? You have life-threatening health problems related to obesity. Follow these instructions at home:  Eating and drinking   Follow recommendations from your health care provider about what you eat and drink. Your health care provider may advise you to: ? Limit fast foods, sweets, and processed snack foods. ? Choose low-fat options, such as low-fat milk instead of whole milk. ? Eat 5 or more servings of fruits or vegetables every day. ? Eat at home more often. This gives you more control over what you eat. ? Choose healthy foods when you eat out. ? Learn what a healthy portion size is. ? Keep low-fat snacks on hand. ? Avoid sugary drinks, such as soda, fruit juice, iced tea sweetened with sugar, and flavored milk. ? Eat a healthy breakfast.  Drink enough water to keep your urine clear or pale yellow.  Do not go without eating for long periods of time (do not fast) or follow a fad diet. Fasting and fad diets can be unhealthy and even dangerous. Physical Activity  Exercise regularly, as told by your health care provider. Ask your health care provider what types of exercise are safe for you and how often you should exercise.  Warm  up and stretch before being active.  Cool down and stretch after being active.  Rest between periods of activity. Lifestyle  Limit the time that you spend in front of your TV, computer, or video game system.  Find ways to reward yourself that do not involve food.  Limit alcohol intake to no more than 1 drink a day for nonpregnant women and 2 drinks a day for men. One drink equals 12 oz of beer, 5 oz of wine, or 1 oz of hard liquor. General instructions  Keep a weight loss journal to keep track of the food you eat and how much you exercise you get.  Take over-the-counter and prescription medicines only as told by your health care provider.  Take vitamins and supplements only as told by your health care provider.  Consider joining a support group. Your health care provider may be able to recommend a support group.  Keep all follow-up visits as told by your health care provider. This is important. Contact a health care provider if:  You are unable to meet your weight loss goal after 6 weeks of dietary and lifestyle changes. This information is not intended to replace advice given to you by your health care provider. Make  sure you discuss any questions you have with your health care provider. Document Released: 01/24/2005 Document Revised: 05/21/2016 Document Reviewed: 10/05/2015 Elsevier Interactive Patient Education  2019 Elsevier Inc.  Preventing Unhealthy [employer], Adult Staying at a healthy weight is important to your overall health. When fat builds up in your body, you may become overweight or obese. Being overweight or obese increases your risk of developing certain health problems, such as heart disease, diabetes, sleeping problems, joint problems, and some types of cancer. Unhealthy weight gain is often the result of making unhealthy food choices or not getting enough exercise. You can make changes to your lifestyle to prevent obesity and stay as healthy as possible. What  nutrition changes can be made?   Eat only as much as your body needs. To do this: ? Pay attention to signs that you are hungry or full. Stop eating as soon as you feel full. ? If you feel hungry, try drinking water first before eating. Drink enough water so your urine is clear or pale yellow. ? Eat smaller portions. Pay attention to portion sizes when eating out. ? Look at serving sizes on food labels. Most foods contain more than one serving per container. ? Eat the recommended number of calories for your gender and activity level. For most active people, a daily total of 2,000 calories is appropriate. If you are trying to lose weight or are not very active, you may need to eat fewer calories. Talk with your health care provider or a diet and nutrition specialist (dietitian) about how many calories you need each day.  Choose healthy foods, such as: ? Fruits and vegetables. At each meal, try to fill at least half of your plate with fruits and vegetables. ? Whole grains, such as whole-wheat bread, brown rice, and quinoa. ? Lean meats, such as chicken or fish. ? Other healthy proteins, such as beans, eggs, or tofu. ? Healthy fats, such as nuts, seeds, fatty fish, and olive oil. ? Low-fat or fat-free dairy products.  Check food labels, and avoid food and drinks that: ? Are high in calories. ? Have added sugar. ? Are high in sodium. ? Have saturated fats or trans fats.  Cook foods in healthier ways, such as by baking, broiling, or grilling.  Make a meal plan for the week, and shop with a grocery list to help you stay on track with your purchases. Try to avoid going to the grocery store when you are hungry.  When grocery shopping, try to shop around the outside of the store first, where the fresh foods are. Doing this helps you to avoid prepackaged foods, which can be high in sugar, salt (sodium), and fat. What lifestyle changes can be made?   Exercise for 30 or more minutes on 5 or more  days each week. Exercising may include brisk walking, yard work, biking, running, swimming, and team sports like basketball and soccer. Ask your health care provider which exercises are safe for you.  Do muscle-strengthening activities, such as lifting weights or using resistance bands, on 2 or more days a week.  Do not use any products that contain nicotine or tobacco, such as cigarettes and e-cigarettes. If you need help quitting, ask your health care provider.  Limit alcohol intake to no more than 1 drink a day for nonpregnant women and 2 drinks a day for men. One drink equals 12 oz of beer, 5 oz of wine, or 1 oz of hard liquor.  Try to get  7-9 hours of sleep each night. What other changes can be made?  Keep a food and activity journal to keep track of: ? What you ate and how many calories you had. Remember to count the calories in sauces, dressings, and side dishes. ? Whether you were active, and what exercises you did. ? Your calorie, weight, and activity goals.  Check your weight regularly. Track any changes. If you notice you have gained weight, make changes to your diet or activity routine.  Avoid taking weight-loss medicines or supplements. Talk to your health care provider before starting any new medicine or supplement.  Talk to your health care provider before trying any new diet or exercise plan. Why are these changes important? Eating healthy, staying active, and having healthy habits can help you to prevent obesity. Those changes also:  Help you manage stress and emotions.  Help you connect with friends and family.  Improve your self-esteem.  Improve your sleep.  Prevent long-term health problems. What can happen if changes are not made? Being obese or overweight can cause you to develop joint or bone problems, which can make it hard for you to stay active or do activities you enjoy. Being obese or overweight also puts stress on your heart and lungs and can lead to  health problems like diabetes, heart disease, and some cancers. Where to find more information Talk with your health care provider or a dietitian about healthy eating and healthy lifestyle choices. You may also find information from:  U.S. Department of Agriculture, MyPlate: FormerBoss.no  American Heart Association: www.heart.org  Centers for Disease Control and Prevention: http://www.wolf.info/ Summary  Staying at a healthy weight is important to your overall health. It helps you to prevent certain diseases and health problems, such as heart disease, diabetes, joint problems, sleep disorders, and some types of cancer.  Being obese or overweight can cause you to develop joint or bone problems, which can make it hard for you to stay active or do activities you enjoy.  You can prevent unhealthy weight gain by eating a healthy diet, exercising regularly, not smoking, limiting alcohol, and getting enough sleep.  Talk with your health care provider or a dietitian for guidance about healthy eating and healthy lifestyle choices. This information is not intended to replace advice given to you by your health care provider. Make sure you discuss any questions you have with your health care provider. Document Released: 12/18/2016 Document Revised: 09/27/2017 Document Reviewed: 01/23/2017 Elsevier Interactive Patient Education  2019 [employer].

## 2018-12-22 NOTE — Progress Notes (Signed)
BP 134/82 (BP Location: Left Arm, Patient Position: Sitting, Cuff Size: Large)   Pulse 89   Temp 98.3 F (36.8 C) (Oral)   Resp 16   Ht 5' 10.25" (1.784 m)   Wt 232 lb 4.8 oz (105.4 kg)   SpO2 98%   BMI 33.09 kg/m    Subjective:    Patient ID: Martin Parks, male    DOB: [birth year], 43 y.o.   MRN: [id number]  HPI: Martin Parks is a 43 y.o. male  Chief Complaint  Patient presents with  . Establish Care    used to be with [facility]  . Umbilical Hernia  . Sterilization    would like a referral  . Labs Only  . Immunizations    declines flu shot    HPI Here to establish care Umbilical hernia; 37-16 years duration; thinking about getting it fixed; son pokes it and it gets tender; had left sided inguinal hernia years ago and had a few complications afterwards from nerve irritation; mesh  Sterilization; would like referral to consider vasectomy; ready to see about that  Ibuprofen occasionally; tries to not take; using tumeric instead; osteobiflex  Anxiety; not really depression; works at [employer]; has a lot of responsibility there; always had issues with anxiety, skyrocketed with having children and then BP went up; then prozac; then seeing Dr. Toy Care and on higher dose of prozac, then off of prozac; on pristiq since January; working well with his mood; hyperactive, ADHD and anxiety  Depression screen [facility] 2/9 12/22/2018 03/07/2016  Decreased Interest 0 0  Down, Depressed, Hopeless 1 0  PHQ - 2 Score 1 0  Altered sleeping 2 1  Tired, decreased energy 2 1  Change in appetite 1 0  Feeling bad or failure about yourself  0 0  Trouble concentrating 0 0  Moving slowly or fidgety/restless 0 0  Suicidal thoughts 0 0  PHQ-9 Score 6 2  Difficult doing work/chores Somewhat difficult -   Fall Risk  12/22/2018 03/07/2016  Falls in the past year? 0 No  Number falls in past yr: 0 -  Injury with Fall? 0 -    Relevant past medical, surgical, family and social  history reviewed Past Medical History:  Diagnosis Date  . Anxiety   . Depression   . GERD (gastroesophageal reflux disease)   . Hypertension    Past Surgical History:  Procedure Laterality Date  . HERNIA REPAIR    . NO PAST SURGERIES    . TONSILLECTOMY     Family History  Problem Relation Age of Onset  . Cancer Sister        breast   Social History   Tobacco Use  . Smoking status: Never Smoker  . Smokeless tobacco: Never Used  Substance Use Topics  . Alcohol use: Yes    Alcohol/week: 0.0 standard drinks    Comment: occasional  . Drug use: No     Office Visit from 12/22/2018 in [facility]  AUDIT-C Score  2      Interim medical history since last visit reviewed. Allergies and medications reviewed  Review of Systems Per HPI unless specifically indicated above     Objective:    BP 134/82 (BP Location: Left Arm, Patient Position: Sitting, Cuff Size: Large)   Pulse 89   Temp 98.3 F (36.8 C) (Oral)   Resp 16   Ht 5' 10.25" (1.784 m)   Wt 232 lb 4.8 oz (105.4 kg)   SpO2 98%  BMI 33.09 kg/m   Wt Readings from Last 3 Encounters:  12/22/18 232 lb 4.8 oz (105.4 kg)  04/21/17 220 lb (99.8 kg)  01/27/15 209 lb (94.8 kg)    Physical Exam Constitutional:      General: He is not in acute distress.    Appearance: He is well-developed. He is obese.  Eyes:     General: No scleral icterus. Cardiovascular:     Rate and Rhythm: Normal rate and regular rhythm.  Pulmonary:     Effort: Pulmonary effort is normal.     Breath sounds: Normal breath sounds.  Abdominal:     Hernia: A hernia is present. Hernia is present in the umbilical area.     Comments: Reducible, tender to palpation  Skin:    Coloration: Skin is not pale.     Comments: No palmar erythema  Neurological:     Mental Status: He is alert.     Results for orders placed or performed in visit on 03/07/16  CBC with Differential/Platelet  Result Value Ref Range   WBC 5.5 3.4 -  10.8 x10E3/uL   RBC 4.65 4.14 - 5.80 x10E6/uL   Hemoglobin 14.1 12.6 - 17.7 g/dL   Hematocrit 41.9 37.5 - 51.0 %   MCV 90 79 - 97 fL   MCH 30.3 26.6 - 33.0 pg   MCHC 33.7 31.5 - 35.7 g/dL   RDW 13.7 12.3 - 15.4 %   Platelets 191 150 - 379 x10E3/uL   Neutrophils 50 %   Lymphs 37 %   Monocytes 9 %   Eos 3 %   Basos 1 %   Neutrophils Absolute 2.8 1.4 - 7.0 x10E3/uL   Lymphocytes Absolute 2.0 0.7 - 3.1 x10E3/uL   Monocytes Absolute 0.5 0.1 - 0.9 x10E3/uL   EOS (ABSOLUTE) 0.1 0.0 - 0.4 x10E3/uL   Basophils Absolute 0.0 0.0 - 0.2 x10E3/uL   Immature Granulocytes 0 %   Immature Grans (Abs) 0.0 0.0 - 0.1 x10E3/uL  Comprehensive metabolic panel  Result Value Ref Range   Glucose 80 65 - 99 mg/dL   BUN 12 6 - 24 mg/dL   Creatinine, Ser 0.76 0.76 - 1.27 mg/dL   GFR calc non Af Amer 114 >59 mL/min/1.73   GFR calc Af Amer 132 >59 mL/min/1.73   BUN/Creatinine Ratio 16 9 - 20   Sodium 140 134 - 144 mmol/L   Potassium 3.8 3.5 - 5.2 mmol/L   Chloride 101 96 - 106 mmol/L   CO2 23 18 - 29 mmol/L   Calcium 8.9 8.7 - 10.2 mg/dL   Total Protein 6.8 6.0 - 8.5 g/dL   Albumin 4.3 3.5 - 5.5 g/dL   Globulin, Total 2.5 1.5 - 4.5 g/dL   Albumin/Globulin Ratio 1.7 1.1 - 2.5   Bilirubin Total 0.2 0.0 - 1.2 mg/dL   Alkaline Phosphatase 77 39 - 117 IU/L   AST 16 0 - 40 IU/L   ALT 33 0 - 44 IU/L  Lipid Panel w/o Chol/HDL Ratio  Result Value Ref Range   Cholesterol, Total 226 (H) 100 - 199 mg/dL   Triglycerides 353 (H) 0 - 149 mg/dL   HDL 36 (L) >39 mg/dL   VLDL Cholesterol Cal 71 (H) 5 - 40 mg/dL   LDL Calculated 119 (H) 0 - 99 mg/dL  PSA  Result Value Ref Range   Prostate Specific Ag, Serum 0.3 0.0 - 4.0 ng/mL  Urinalysis, Routine w reflex microscopic (not at [facility])  Result Value Ref Range  Specific Gravity, UA 1.020 1.005 - 1.030   pH, UA 5.5 5.0 - 7.5   Color, UA Yellow Yellow   Appearance Ur Clear Clear   Leukocytes, UA Negative Negative   Protein, UA Negative Negative/Trace   Glucose,  UA Negative Negative   Ketones, UA Negative Negative   RBC, UA Negative Negative   Bilirubin, UA Negative Negative   Urobilinogen, Ur 0.2 0.2 - 1.0 mg/dL   Nitrite, UA Negative Negative  HIV antibody  Result Value Ref Range   HIV Screen 4th Generation wRfx Non Reactive Non Reactive  TSH  Result Value Ref Range   TSH 1.680 0.450 - 4.500 uIU/mL      Assessment & Plan:   Problem List Items Addressed This Visit      Other   Umbilical hernia without obstruction and without gangrene - Primary    No signs of obstruction; refer to surgeon; in the meantime, to ER if s/s of gangrene, obstruction      Relevant Orders   Ambulatory referral to General Surgery   Obesity (BMI 30.0-34.9)    Encouraged 64 ounces of water a day; limit sugary drinks; try to gradually increase activity      Hyperlipidemia LDL goal <100   Hyperactivity    On Pristiq      Generalized anxiety disorder (Chronic)    Seeing Dr. Toy Care, psychiatrist in [place]      Relevant Medications   desvenlafaxine (PRISTIQ) 100 MG 24 hr tablet    Other Visit Diagnoses    Weight gain, abnormal       Relevant Orders   TSH   Encounter for vasectomy counseling       Relevant Orders   Ambulatory referral to Urology   Preventative health care       Relevant Orders   TSH   COMPLETE METABOLIC PANEL WITH GFR   CBC with Differential/Platelet   Lipid panel       Follow up plan: Return in about 6 weeks (around 02/02/2019) for complete physical.  An after-visit summary was printed and given to the patient at [facility].  Please see the patient instructions which may contain other information and recommendations beyond what is mentioned above in the assessment and plan.  No orders of the defined types were placed in this encounter.   Orders Placed This Encounter  Procedures  . TSH  . COMPLETE METABOLIC PANEL WITH GFR  . CBC with Differential/Platelet  . Lipid panel  . Ambulatory referral to General Surgery  .  Ambulatory referral to Urology

## 2018-12-22 NOTE — Assessment & Plan Note (Signed)
Seeing Dr. Toy Care, psychiatrist in [place]

## 2018-12-22 NOTE — Assessment & Plan Note (Signed)
On Pristiq

## 2018-12-23 LAB — COMPLETE METABOLIC PANEL WITH GFR
AG Ratio: 1.8 (calc) (ref 1.0–2.5)
ALT: 33 U/L (ref 9–46)
AST: 19 U/L (ref 10–40)
Albumin: 4.4 g/dL (ref 3.6–5.1)
Alkaline phosphatase (APISO): 69 U/L (ref 40–115)
BILIRUBIN TOTAL: 0.4 mg/dL (ref 0.2–1.2)
BUN: 13 mg/dL (ref 7–25)
CO2: 30 mmol/L (ref 20–32)
Calcium: 9 mg/dL (ref 8.6–10.3)
Chloride: 106 mmol/L (ref 98–110)
Creat: 0.72 mg/dL (ref 0.60–1.35)
GFR, Est African American: 133 mL/min/{1.73_m2} (ref >=60)
GFR, Est Non African American: 115 mL/min/{1.73_m2} (ref >=60)
Globulin: 2.5 g/dL (calc) (ref 1.9–3.7)
Glucose, Bld: 81 mg/dL (ref 65–139)
Potassium: 4.1 mmol/L (ref 3.5–5.3)
Sodium: 143 mmol/L (ref 135–146)
TOTAL PROTEIN: 6.9 g/dL (ref 6.1–8.1)

## 2018-12-23 LAB — LIPID PANEL
Cholesterol: 222 mg/dL — ABNORMAL HIGH (ref <200)
HDL: 42 mg/dL (ref >40)
LDL Cholesterol (Calc): 144 mg/dL (calc) — ABNORMAL HIGH
Non-HDL Cholesterol (Calc): 180 mg/dL (calc) — ABNORMAL HIGH (ref <130)
Total CHOL/HDL Ratio: 5.3 (calc) — ABNORMAL HIGH (ref <5.0)
Triglycerides: 222 mg/dL — ABNORMAL HIGH (ref <150)

## 2018-12-23 LAB — CBC WITH DIFFERENTIAL/PLATELET
Absolute Monocytes: 490 cells/uL (ref 200–950)
Basophils Absolute: 50 cells/uL (ref 0–200)
Basophils Relative: 1 %
EOS PCT: 2.8 %
Eosinophils Absolute: 140 cells/uL (ref 15–500)
HEMATOCRIT: 42.3 % (ref 38.5–50.0)
Hemoglobin: 14.6 g/dL (ref 13.2–17.1)
Lymphs Abs: 1825 cells/uL (ref 850–3900)
MCH: 30.5 pg (ref 27.0–33.0)
MCHC: 34.5 g/dL (ref 32.0–36.0)
MCV: 88.5 fL (ref 80.0–100.0)
MPV: 11.3 fL (ref 7.5–12.5)
Monocytes Relative: 9.8 %
Neutro Abs: 2495 cells/uL (ref 1500–7800)
Neutrophils Relative %: 49.9 %
Platelets: 205 10*3/uL (ref 140–400)
RBC: 4.78 10*6/uL (ref 4.20–5.80)
RDW: 12.6 % (ref 11.0–15.0)
Total Lymphocyte: 36.5 %
WBC: 5 10*3/uL (ref 3.8–10.8)

## 2018-12-23 LAB — TSH: TSH: 1.67 mIU/L (ref 0.40–4.50)

## 2019-01-01 ENCOUNTER — Other Ambulatory Visit: Payer: Self-pay

## 2019-01-01 ENCOUNTER — Encounter: Payer: Self-pay | Admitting: Surgery

## 2019-01-01 ENCOUNTER — Ambulatory Visit: Payer: Commercial Managed Care - PPO | Admitting: Surgery

## 2019-01-01 VITALS — BP 137/98 | HR 92 | Temp 98.6°F | Ht 70.25 in | Wt 238.6 lb

## 2019-01-01 DIAGNOSIS — K429 Umbilical hernia without obstruction or gangrene: Secondary | ICD-10-CM

## 2019-01-01 NOTE — Patient Instructions (Addendum)
Patient is to be scheduled for hernia repair surgery with Dr.Davis. Will call back to schedule surgery.    Call the office with any questions or concerns.    Open Hernia Repair, Adult  Open hernia repair is a surgical procedure to fix a hernia. A hernia occurs when an internal organ or tissue pushes out through a weak spot in the abdominal wall muscles. Hernias commonly occur in the groin and around the navel. Most hernias tend to get worse over time. Often, surgery is done to prevent the hernia from becoming bigger, uncomfortable, or an emergency. Emergency surgery may be needed if abdominal contents get stuck in the opening (incarcerated hernia) or the blood supply gets cut off (strangulated hernia). In an open repair, an incision is made in the abdomen to perform the surgery. Tell a health care provider about:  Any allergies you have.  All medicines you are taking, including vitamins, herbs, eye drops, creams, and over-the-counter medicines.  Any problems you or family members have had with anesthetic medicines.  Any blood or bone disorders you have.  Any surgeries you have had.  Any medical conditions you have, including any recent cold or flu symptoms.  Whether you are pregnant or may be pregnant. What are the risks? Generally, this is a safe procedure. However, problems may occur, including:  Long-lasting (chronic) pain.  Bleeding.  Infection.  Damage to the testicle. This can cause shrinking or swelling.  Damage to the bladder, blood vessels, intestine, or nerves near the hernia.  Trouble passing urine.  Allergic reactions to medicines.  Return of the hernia. What happens before the procedure? Staying hydrated Follow instructions from your health care provider about hydration, which may include:  Up to 2 hours before the procedure - you may continue to drink clear liquids, such as water, clear fruit juice, black coffee, and plain tea. Eating and drinking  restrictions Follow instructions from your health care provider about eating and drinking, which may include:  8 hours before the procedure - stop eating heavy meals or foods such as meat, fried foods, or fatty foods.  6 hours before the procedure - stop eating light meals or foods, such as toast or cereal.  6 hours before the procedure - stop drinking milk or drinks that contain milk.  2 hours before the procedure - stop drinking clear liquids. Medicines  Ask your health care provider about: ? Changing or stopping your regular medicines. This is especially important if you are taking diabetes medicines or blood thinners. ? Taking medicines such as aspirin and ibuprofen. These medicines can thin your blood. Do not take these medicines before your procedure if your health care provider instructs you not to.  You may be given antibiotic medicine to help prevent infection. General instructions  You may have blood tests or imaging studies.  Ask your health care provider how your surgical site will be marked or identified.  If you smoke, do not smoke for at least 2 weeks before your procedure or for as long as told by your health care provider.  Let your health care provider know if you develop a cold or any infection before your surgery.  Plan to have someone take you home from the hospital or clinic.  If you will be going home right after the procedure, plan to have someone with you for 24 hours. What happens during the procedure?  To reduce your risk of infection: ? Your health care team will wash or sanitize their hands. ?  Your skin will be washed with soap. ? Hair may be removed from the surgical area.  An IV tube will be inserted into one of your veins.  You will be given one or more of the following: ? A medicine to help you relax (sedative). ? A medicine to numb the area (local anesthetic). ? A medicine to make you fall asleep (general anesthetic).  Your surgeon will  make an incision over the hernia.  The tissues of the hernia will be moved back into place.  The edges of the hernia may be stitched together.  The opening in the abdominal muscles will be closed with stitches (sutures). Or, your surgeon will place a mesh patch made of manmade (synthetic) material over the opening.  The incision will be closed.  A bandage (dressing) may be placed over the incision. The procedure may vary among health care providers and hospitals. What happens after the procedure?  Your blood pressure, heart rate, breathing rate, and blood oxygen level will be monitored until the medicines you were given have worn off.  You may be given medicine for pain.  Do not drive for 24 hours if you received a sedative. This information is not intended to replace advice given to you by your health care provider. Make sure you discuss any questions you have with your health care provider. Document Released: 06/12/2001 Document Revised: 07/06/2016 Document Reviewed: 05/30/2016 Elsevier Interactive Patient Education  2019 [employer]    Umbilical Hernia, Adult  A hernia is a bulge of tissue that pushes through an opening between muscles. An umbilical hernia happens in the abdomen, near the belly button (umbilicus). The hernia may contain tissues from the small intestine, large intestine, or fatty tissue covering the intestines (omentum). Umbilical hernias in adults tend to get worse over time, and they require surgical treatment. There are several types of umbilical hernias. You may have:  A hernia located just above or below the umbilicus (indirect hernia). This is the most common type of umbilical hernia in adults.  A hernia that forms through an opening formed by the umbilicus (direct hernia).  A hernia that comes and goes (reducible hernia). A reducible hernia may be visible only when you strain, lift something heavy, or cough. This type of hernia can be pushed back into  the abdomen (reduced).  A hernia that traps abdominal tissue inside the hernia (incarcerated hernia). This type of hernia cannot be reduced.  A hernia that cuts off blood flow to the tissues inside the hernia (strangulated hernia). The tissues can start to die if this happens. This type of hernia requires emergency treatment. What are the causes? An umbilical hernia happens when tissue inside the abdomen presses on a weak area of the abdominal muscles. What increases the risk? You may have a greater risk of this condition if you:  Are obese.  Have had several pregnancies.  Have a buildup of fluid inside your abdomen (ascites).  Have had surgery that weakens the abdominal muscles. What are the signs or symptoms? The main symptom of this condition is a painless bulge at or near the belly button. A reducible hernia may be visible only when you strain, lift something heavy, or cough. Other symptoms may include:  Dull pain.  A feeling of pressure. Symptoms of a strangulated hernia may include:  Pain that gets increasingly worse.  Nausea and vomiting.  Pain when pressing on the hernia.  Skin over the hernia becoming red or purple.  Constipation.  Blood  in the stool. How is this diagnosed? This condition may be diagnosed based on:  A physical exam. You may be asked to cough or strain while standing. These actions increase the pressure inside your abdomen and force the hernia through the opening in your muscles. Your health care provider may try to reduce the hernia by pressing on it.  Your symptoms and medical history. How is this treated? Surgery is the only treatment for an umbilical hernia. Surgery for a strangulated hernia is done as soon as possible. If you have a small hernia that is not incarcerated, you may need to lose weight before having surgery. Follow these instructions at home:  Lose weight, if told by your health care provider.  Do not try to push the hernia  back in.  Watch your hernia for any changes in color or size. Tell your health care provider if any changes occur.  You may need to avoid activities that increase pressure on your hernia.  Do not lift anything that is heavier than 10 lb (4.5 kg) until your health care provider says that this is safe.  Take over-the-counter and prescription medicines only as told by your health care provider.  Keep all follow-up visits as told by your health care provider. This is important. Contact a health care provider if:  Your hernia gets larger.  Your hernia becomes painful. Get help right away if:  You develop sudden, severe pain near the area of your hernia.  You have pain as well as nausea or vomiting.  You have pain and the skin over your hernia changes color.  You develop a fever. This information is not intended to replace advice given to you by your health care provider. Make sure you discuss any questions you have with your health care provider. Document Released: 05/18/2016 Document Revised: 01/29/2018 Document Reviewed: 06/17/2017 Elsevier Interactive Patient Education  2019 [employer].

## 2019-01-01 NOTE — Progress Notes (Signed)
Surgical Clinic History and Physical  Referring provider:  Arnetha Courser, MD [address], [place] [address]  HISTORY OF PRESENT ILLNESS (HPI):  44 y.o. male presents for evaluation of peri-umbilical pain. Patient reports he's had a known umbilical hernia >75 years and performs frequent strenuous activities around his house, including chopping of wood, mechanical automotive work, and more recently kickboxing with his growing 55 year old son. His hernia was previously evaluated, but did not bother him as much at that time. Particularly as his son gets older and has been increasingly able to participate in physically demanding activities, his umbilical hernia has become increasingly bothersome. He otherwise denies constipation, blood per rectum, straining with urination, or coughing/smoking. Of note, patient previously underwent laparoscopic repair of a Left inguinal hernia and required post-surgical injections for nerve-associated pain and continues to experience a "stretching" sensation above his Left groin. He also describes "sensitivity" to adhesive tapes, including band-aids and similar bandages (steri-strips), but has not experienced skin glue without adhesive tapes.   PAST MEDICAL HISTORY (PMH):  Past Medical History:  Diagnosis Date  . Anxiety   . Depression   . GERD (gastroesophageal reflux disease)   . Hypertension     PAST SURGICAL HISTORY ([place]):  Past Surgical History:  Procedure Laterality Date  . HERNIA REPAIR    . NO PAST SURGERIES    . TONSILLECTOMY      MEDICATIONS:  Prior to Admission medications   Medication Sig Start Date End Date Taking? Authorizing Provider  ALPRAZolam Duanne Moron) 0.5 MG tablet 0.5 mg daily as needed. 02/02/16  Yes [provider]  desvenlafaxine (PRISTIQ) 100 MG 24 hr tablet Take 100 mg by mouth daily.   Yes [provider]  fluticasone (FLONASE) 50 MCG/ACT nasal spray SHAKE WELL AND USE 2 SPRAYS IN EACH NOSTRIL  DAILY 06/14/16  Yes Johnson, Megan P, DO  Misc Natural Products (OSTEO BI-FLEX ADV TRIPLE ST) TABS Take 1 tablet by mouth daily.   Yes [provider]  Omega-3 Fatty Acids (FISH OIL PO) Take 1 tablet by mouth daily. Coated soft gel   Yes [provider]  Turmeric 500 MG CAPS Take 1 capsule by mouth daily.   Yes [provider]    ALLERGIES:  Allergies  Allergen Reactions  . Adhesive [Tape] Rash    SOCIAL HISTORY:  Social History   Socioeconomic History  . Marital status: Married    Spouse name: Willette Brace  . Number of children: 2  . Years of education: Not on file  . Highest education level: Bachelor's degree (e.g., BA, AB, BS)  Occupational History  . Not on file  Social Needs  . Financial resource strain: Not hard at all  . Food insecurity:    Worry: Never true    Inability: Never true  . Transportation needs:    Medical: No    Non-medical: No  Tobacco Use  . Smoking status: Never Smoker  . Smokeless tobacco: Never Used  Substance and Sexual Activity  . Alcohol use: Yes    Alcohol/week: 0.0 standard drinks    Comment: occasional  . Drug use: No  . Sexual activity: Yes    Partners: Female  Lifestyle  . Physical activity:    Days per week: 7 days    Minutes per session: 60 min  . Stress: Very much  Relationships  . Social connections:    Talks on phone: Three times a week    Gets together: Never  Attends religious service: More than 4 times per year    Active member of club or organization: Yes    Attends meetings of clubs or organizations: More than 4 times per year    Relationship status: Married  . Intimate partner violence:    Fear of current or ex partner: No    Emotionally abused: No    Physically abused: No    Forced sexual activity: No  Other Topics Concern  . Not on file  Social History Narrative  . Not on file    The patient currently resides (home / rehab facility / nursing home): Home The patient normally is  (ambulatory / bedbound): Ambulatory  FAMILY HISTORY:  Family History  Problem Relation Age of Onset  . Cancer Sister        breast    Otherwise negative/non-contributory.  REVIEW OF SYSTEMS:  Constitutional: denies any other weight loss, fever, chills, or sweats  Eyes: denies any other vision changes, history of eye injury  ENT: denies sore throat, hearing problems  Respiratory: denies shortness of breath, wheezing  Cardiovascular: denies chest pain, palpitations  Gastrointestinal: abdominal pain, N/V, and bowel function as per HPI Musculoskeletal: denies any other joint pains or cramps  Skin: Denies any other rashes or skin discolorations except as per HPI Neurological: denies any other headache, dizziness, weakness  Psychiatric: Denies any other depression, anxiety   All other review of systems were otherwise negative   VITAL SIGNS:  BP (!) 137/98   Pulse 92   Temp 98.6 F (37 C) (Temporal)   Ht 5' 10.25" (1.784 m)   Wt 238 lb 9.6 oz (108.2 kg)   SpO2 97%   BMI 33.99 kg/m    PHYSICAL EXAM:  Constitutional:  -- Normal body habitus  -- Awake, alert, and oriented x3  Eyes:  -- Pupils equally round and reactive to light  -- No scleral icterus  Ear, nose, throat:  -- No jugular venous distension -- No nasal drainage, bleeding Pulmonary:  -- No crackles  -- Equal breath sounds bilaterally -- Breathing non-labored at rest Cardiovascular:  -- S1, S2 present  -- No pericardial rubs  Gastrointestinal:  -- Abdomen soft and non-distended, no guarding/rebound tenderness -- Easily reducible focally tender to palpation umbilical hernia -- No abdominal masses appreciated, pulsatile or otherwise; specifically, no appreciable Left inguinal hernia or Left groin tenderness to palpation Musculoskeletal and Integumentary:  -- Wounds or skin discoloration: None appreciated -- Extremities: B/L UE and LE FROM, hands and feet warm, no edema  Neurologic:  -- Motor function: Intact  and symmetric -- Sensation: Intact and symmetric  Labs:  CBC Latest Ref Rng & Units 12/22/2018 09/25/2018 03/07/2016  WBC 3.8 - 10.8 Thousand/uL 5.0 5.3 5.5  Hemoglobin 13.2 - 17.1 g/dL 14.6 14.6 14.1  Hematocrit 38.5 - 50.0 % 42.3 43 41.9  Platelets 140 - 400 Thousand/uL 205 - 191   CMP Latest Ref Rng & Units 12/22/2018 09/25/2018 03/07/2016  Glucose 65 - 139 mg/dL 81 - 80  BUN 7 - 25 mg/dL 13 11 12   Creatinine 0.60 - 1.35 mg/dL 0.72 0.8 0.76  Sodium 135 - 146 mmol/L 143 142 140  Potassium 3.5 - 5.3 mmol/L 4.1 4.2 3.8  Chloride 98 - 110 mmol/L 106 - 101  CO2 20 - 32 mmol/L 30 - 23  Calcium 8.6 - 10.3 mg/dL 9.0 - 8.9  Total Protein 6.1 - 8.1 g/dL 6.9 - 6.8  Total Bilirubin 0.2 - 1.2 mg/dL 0.4 - 0.2  Alkaline Phos 39 - 117 IU/L - - 77  AST 10 - 40 U/L 19 19 16   ALT 9 - 46 U/L 33 35 33   Imaging studies: No new pertinent imaging studies available for review at this time   Assessment/Plan: (ICD-10's: K49.9) 44 y.o. male with increasingly symptomatic easily reducible umbilical hernia, complicated by pertinent comorbidities including HTN, GERD, adhesive tape sensitivity, generalized anxiety disorder, and major depression disorder.               - discussed with patient signs and symptoms of hernia incarceration and obstruction             - strategies for manual self-reduction of patient's umbilical hernia also reviewed and discussed  - maintain hydration with high fiber heart healthy diet to reduce/minimize constipation +/- daily stool softener as needed             - all risks, benefits, and alternatives to open repair of umbilical hernia with mesh were discussed with the patient, all of his questions were answered to patient's expressed satisfaction, patient expresses he plans to proceed, and informed consent was obtained.             - will plan for open repair of umbilical hernia with mesh, patient to call to schedule             - anticipate return to clinic 2 weeks after above planned  surgery             - instructed to call if any questions or concerns  All of the above recommendations were discussed with the patient, and all of patient's questions were answered to his expressed satisfaction.  Thank you for the opportunity to participate in this patient's care.  -- Marilynne Drivers Rosana Hoes, MD, [place]: [facility] - Partnering for exceptional care. Office: [phone]

## 2019-01-12 ENCOUNTER — Encounter: Payer: Managed Care, Other (non HMO) | Admitting: Family Medicine

## 2019-01-29 ENCOUNTER — Ambulatory Visit (INDEPENDENT_AMBULATORY_CARE_PROVIDER_SITE_OTHER): Payer: Commercial Managed Care - PPO | Admitting: Urology

## 2019-01-29 ENCOUNTER — Encounter: Payer: Self-pay | Admitting: Urology

## 2019-01-29 VITALS — BP 138/95 | HR 79 | Ht 71.0 in | Wt 230.0 lb

## 2019-01-29 DIAGNOSIS — Z3009 Encounter for other general counseling and advice on contraception: Secondary | ICD-10-CM | POA: Diagnosis not present

## 2019-01-29 NOTE — Progress Notes (Signed)
Error

## 2019-01-29 NOTE — Patient Instructions (Signed)
Pre-Vasectomy Instructions  STOP all aspirin or blood thinners (Aspirin, Plavix, Coumadin, Warfarin, Motrin, Ibuprofen, Advil, Aleve, Naproxen, Naprosyn) for 7 days prior to the procedure.  If you have any questions about stopping these medications please contact your primary care physician or cardiologist.  Shave all hair from the upper scrotum on the day of the procedure.  This means just under the penis onto the scrotal sac.  The area shaved should measure about 2-3 inches around.  You may lather the scrotum with soap and water, and shave with a safety razor.  After shaving the area, thoroughly wash the penis and the scrotum, then shower or bathe to remove all the loose hairs.  If needed, wash the area again just before coming in for your circumcision.  It is recommended to have a light meal an hour or so prior to the procedure.  Bring a scrotal support (jock strap or suspensory, or tight jockey shorts or underwear).  Wear comfortable pants or shorts.  While the actual procedure usually takes about 45 minutes, you should be prepared to stay in the office for approximately one hour.  Bring someone with you to drive you home.  If you have any questions or concerns, please feel free to call the office at [phone].    Vasectomy Postoperative Instructions  What to Expect  - slight redness, swelling and scant drainage along the incision  - mild to moderate discomfort  - black and blue (bruising) as the tissue heals  - low grade fever  - scrotal sensitivity and/or tenderness - Edges of the incision may pull apart and heal slowly, sometimes a knot may be present which remains for several months.  This is NORMAL and all part of the healing process. - if stitches are placed, they do not need to be removed - if you have pain or discomfort immediately after the vasectomy, you may use OTC pain medication for relief , ex: tylenol.  After local anesthetic wears off an ice pack will provide  additional comfort and can also prevent swelling if used  Activity  - no sexual intercourse for at lease 5 days depending on comfort  - no heavy lifting for 48-72 hours (anything over 5-10 lbs)  Wound Care  - shower only after 24 hours  - no tub baths, hot tub, or pools for at least 7 days  - ice packs for 48 hours: 30 minutes on and 30 minutes off  Problem to Report  - generalized redness  - increased pain and swelling  - fever greater than 101 F  - significant drainage or bleeding from the wound  TO DO - Ejaculations help to clear the passage of sperm, but you must use another from of birth control until you are told you may discontinue its use!! - You will be given a specimen cup to bring back a semen sample in 3 months to check and see if its clear of sperm.  Only after the semen is sent for analysis and is reported back as clear should you use this as your primary form of birth control!

## 2019-01-29 NOTE — Progress Notes (Signed)
01/29/2019  7:39 AM   [name] November 16, 1975 [id number]  Referring provider: Arnetha Courser, MD [address] [place],  [address]  Chief Complaint  Patient presents with   VAS Consult    HPI: Martin Parks is a 44 y.o. male that presents today for vasectomy counseling.  He is married with 2 children and states he and his wife do not desire additional children.  He is not sure if he wants to proceed with vasectomy but is looking at all of his options.  Denies a history of testicular trauma or pain.  No urinary issues.  No previous scrotal surgeries.   PMH: Past Medical History:  Diagnosis Date   Anxiety    Depression    GERD (gastroesophageal reflux disease)    Hypertension     Surgical History: Past Surgical History:  Procedure Laterality Date   HERNIA REPAIR     NO PAST SURGERIES     TONSILLECTOMY      Home Medications:  Allergies as of 01/29/2019      Reactions   Adhesive [tape] Rash      Medication List       Accurate as of January 29, 2019 11:59 PM. Always use your most recent med list.        ALPRAZolam 0.5 MG tablet Commonly known as:  XANAX 0.5 mg daily as needed.   desvenlafaxine 100 MG 24 hr tablet Commonly known as:  PRISTIQ Take 100 mg by mouth daily.   FISH OIL PO Take 1 tablet by mouth daily. Coated soft gel   fluticasone 50 MCG/ACT nasal spray Commonly known as:  FLONASE SHAKE WELL AND USE 2 SPRAYS IN EACH NOSTRIL DAILY   OSTEO BI-FLEX ADV TRIPLE ST Tabs Take 1 tablet by mouth daily.   Turmeric 500 MG Caps Take 1 capsule by mouth daily.      Allergies:  Allergies  Allergen Reactions   Adhesive [Tape] Rash    Family History: Family History  Problem Relation Age of Onset   Cancer Sister        breast    Social History:  reports that he has never smoked. He has never used smokeless tobacco. He reports current alcohol use. He reports that he does not use  drugs.  ROS: UROLOGY Frequent Urination?: No Hard to postpone urination?: No Burning/pain with urination?: No Get up at night to urinate?: No Leakage of urine?: No Urine stream starts and stops?: No Trouble starting stream?: No Do you have to strain to urinate?: No Blood in urine?: No Urinary tract infection?: No Sexually transmitted disease?: No Injury to kidneys or bladder?: No Painful intercourse?: No Weak stream?: No Erection problems?: No Penile pain?: No  Gastrointestinal Nausea?: No Vomiting?: No Indigestion/heartburn?: No Diarrhea?: No Constipation?: No  Constitutional Fever: No Night sweats?: No Weight loss?: No Fatigue?: No  Skin Skin rash/lesions?: No Itching?: No  Eyes Blurred vision?: No Double vision?: No  Ears/Nose/Throat Sore throat?: No Sinus problems?: No  Hematologic/Lymphatic Swollen glands?: No Easy bruising?: No  Cardiovascular Leg swelling?: No Chest pain?: No  Respiratory Cough?: No Shortness of breath?: No  Endocrine Excessive thirst?: No  Musculoskeletal Back pain?: No Joint pain?: No  Neurological Headaches?: No Dizziness?: No  Psychologic Depression?: No Anxiety?: No  Physical Exam: BP (!) 138/95 (BP Location: Left Arm, Patient Position: Sitting, Cuff Size: Normal)    Pulse 79    Ht 5' 11"  (1.803 m)    Wt 230 lb (104.3 kg)  BMI 32.08 kg/m   Constitutional:  Alert and oriented, No acute distress. Respiratory: Normal respiratory effort, no increased work of breathing. GU: Normal phallus.  Bilateral descended testicles without masses.  Vasa easily palpable bilaterally. Skin: No rashes, bruises or suspicious lesions. Neurologic: Grossly intact, no focal deficits, moving all 4 extremities. Psychiatric: Normal mood and affect.  Assessment & Plan:   43 year old male potentially interested in vasectomy as a means of permanent sterilization.  We had a long discussion about vasectomy. We specifically discussed the  procedure, recovery and the risks, benefits and alternatives of vasectomy. I explained that the procedure entails removal of a segment of each vas deferens, each of which conducts sperm, and that the purpose of this procedure is to cause sterility (inability to produce children or cause pregnancy). Vasectomy is intended to be permanent and irreversible form of contraception. Options for fertility after vasectomy include vasectomy reversal, or sperm retrieval with in vitro fertilization. These options are not always successful, and they may be expensive. We discussed reversible forms of birth control such as condoms, IUD or diaphragms, as well as the option of freezing sperm in a sperm bank prior to the vasectomy procedure. We discussed the importance of avoiding strenuous exercise for four days after vasectomy, and the importance of refraining from any form of ejaculation for seven days after vasectomy. I explained that vasectomy does not produce immediate sterility so another form of contraceptive must be used until sterility is assured by having semen checked for sperm. Thus, a post vasectomy semen analysis is necessary to confirm sterility. Rarely, vasectomy must be repeated. We discussed the approximately 1 in 2,000 risk of pregnancy after vasectomy for men who have post-vasectomy semen analysis showing absent sperm or rare non-motile sperm. Typical side effects include a small amount of oozing blood, some discomfort and mild swelling in the area of incision.  Vasectomy does not affect sexual performance, function, please, sensation, interest, desire, satisfaction, penile erection, volume of semen or ejaculation. Other rare risks include allergy or adverse reaction to an anesthetic, testicular atrophy, hematoma, infection/abscess, prolonged tenderness of the vas deferens, pain, swelling, painful nodule or scar (called sperm granuloma) or epididymtis. We discussed chronic testicular pain syndrome. This has been  reported to occur in as many as 1-2% of men and may be permanent. This can be treated with medication, small procedures or (rarely) surgery.   Abbie Sons, MD [facility] [address], [place], [place] [address] [phone]  I, Stephania Fragmin , am acting as a scribe for Abbie Sons, MD  I, Abbie Sons, MD, have reviewed all documentation for this visit. The documentation on 01/30/19 for the exam, diagnosis, procedures, and orders are all accurate and complete.

## 2019-01-30 ENCOUNTER — Encounter: Payer: Self-pay | Admitting: Urology

## 2019-02-06 ENCOUNTER — Encounter: Payer: Self-pay | Admitting: Family Medicine

## 2019-02-06 ENCOUNTER — Ambulatory Visit: Payer: Commercial Managed Care - PPO | Admitting: Family Medicine

## 2019-02-06 VITALS — BP 132/84 | HR 60 | Temp 98.2°F | Resp 12 | Ht 70.0 in | Wt 231.0 lb

## 2019-02-06 DIAGNOSIS — R1032 Left lower quadrant pain: Secondary | ICD-10-CM

## 2019-02-06 DIAGNOSIS — Z0000 Encounter for general adult medical examination without abnormal findings: Secondary | ICD-10-CM | POA: Diagnosis not present

## 2019-02-06 DIAGNOSIS — E669 Obesity, unspecified: Secondary | ICD-10-CM | POA: Diagnosis not present

## 2019-02-06 NOTE — Progress Notes (Signed)
BP 132/84   Pulse 60   Temp 98.2 F (36.8 C) (Oral)   Resp 12   Ht 5' 10"  (1.778 m)   Wt 231 lb (104.8 kg)   SpO2 95%   BMI 33.15 kg/m    Subjective:    Patient ID: Martin Parks, male    DOB: [birth year], 44 y.o.   MRN: [id number]  HPI: Martin Parks is a 44 y.o. male  Chief Complaint  Patient presents with  . Annual Exam    HPI Patient is here for a physical  Stressed a fair amount; did go to counseling; sees Dr. Toy Care in [place], doing okay; responsibilities at work and home  USPSTF grade A and B recommendations Depression:  Depression screen [facility] 2/9 02/06/2019 12/22/2018 03/07/2016  Decreased Interest 0 0 0  Down, Depressed, Hopeless 0 1 0  PHQ - 2 Score 0 1 0  Altered sleeping 0 2 1  Tired, decreased energy 0 2 1  Change in appetite 0 1 0  Feeling bad or failure about yourself  0 0 0  Trouble concentrating 0 0 0  Moving slowly or fidgety/restless 0 0 0  Suicidal thoughts 0 0 0  PHQ-9 Score 0 6 2  Difficult doing work/chores Not difficult at all Somewhat difficult -   Hypertension: BP Readings from Last 3 Encounters:  02/06/19 132/84  01/29/19 (!) 138/95  01/01/19 (!) 137/98   Obesity: Wt Readings from Last 3 Encounters:  02/06/19 231 lb (104.8 kg)  01/29/19 230 lb (104.3 kg)  01/01/19 238 lb 9.6 oz (108.2 kg)   BMI Readings from Last 3 Encounters:  02/06/19 33.15 kg/m  01/29/19 32.08 kg/m  01/01/19 33.99 kg/m    Immunizations: UTD tetanus; shingrix at age 67 recommended; does not want flu shot  Skin cancer: wants to see dermatologist, used to see derm and she left; saw Dr. Phillip Heal and would like to see another; gave name; he'll call his own; nothing worrisome; just had one removed from back last summer; watching, lifeguard and farmer for years;redness on the cheek, blue light on monitors Lung cancer:  Nonsmoker; just social in college Prostate cancer: not in the family Lab Results  Component Value Date   PSA 0.4 09/25/2018    Colorectal cancer: no fam hx; start at age 23 AAA: n/a Aspirin: n/a Diet: good eater Exercise: schedule is conflicting with exercise; likes to be active, not formal gym Alcohol:    Office Visit from 02/06/2019 in [facility]  AUDIT-C Score  0     Tobacco use: non HIV, hep B, hep C: not intersted STD testing and prevention (chl/gon/syphilis): not interested Glucose:  Glucose  Date Value Ref Range Status  03/07/2016 80 65 - 99 mg/dL Final  01/27/2013 128 (H) 65 - 99 mg/dL Final   Glucose, Bld  Date Value Ref Range Status  12/22/2018 81 65 - 139 mg/dL Final    Comment:    .        Non-fasting reference interval .    Lipids:  Lab Results  Component Value Date   CHOL 222 (H) 12/22/2018   CHOL 212 (A) 09/25/2018   CHOL 226 (H) 03/07/2016   Lab Results  Component Value Date   HDL 42 12/22/2018   HDL 45 09/25/2018   HDL 36 (L) 03/07/2016   Lab Results  Component Value Date   LDLCALC 144 (H) 12/22/2018   LDLCALC 144 09/25/2018   LDLCALC 119 (H) 03/07/2016   Lab Results  Component Value Date   TRIG 222 (H) 12/22/2018   TRIG 116 09/25/2018   TRIG 353 (H) 03/07/2016   Lab Results  Component Value Date   CHOLHDL 5.3 (H) 12/22/2018   No results found for: LDLDIRECT   Depression screen [facility] 2/9 02/06/2019 12/22/2018 03/07/2016  Decreased Interest 0 0 0  Down, Depressed, Hopeless 0 1 0  PHQ - 2 Score 0 1 0  Altered sleeping 0 2 1  Tired, decreased energy 0 2 1  Change in appetite 0 1 0  Feeling bad or failure about yourself  0 0 0  Trouble concentrating 0 0 0  Moving slowly or fidgety/restless 0 0 0  Suicidal thoughts 0 0 0  PHQ-9 Score 0 6 2  Difficult doing work/chores Not difficult at all Somewhat difficult -   Fall Risk  02/06/2019 01/01/2019 12/22/2018 03/07/2016  Falls in the past year? 0 0 0 No  Number falls in past yr: 0 0 0 -  Injury with Fall? 0 0 0 -    Relevant past medical, surgical, family and social history reviewed Past  Medical History:  Diagnosis Date  . Anxiety   . Depression   . GERD (gastroesophageal reflux disease)   . Hypertension    Past Surgical History:  Procedure Laterality Date  . HERNIA REPAIR    . NO PAST SURGERIES    . TONSILLECTOMY     Family History  Problem Relation Age of Onset  . Cancer Sister        breast  sister in her 75's; works for [employer], in and out of airports, with phone in the front chest pocket  Social History   Tobacco Use  . Smoking status: Never Smoker  . Smokeless tobacco: Never Used  Substance Use Topics  . Alcohol use: Never    Alcohol/week: 0.0 standard drinks    Frequency: Never  . Drug use: No     Office Visit from 02/06/2019 in [facility]  AUDIT-C Score  0      Interim medical history since last visit reviewed. Allergies and medications reviewed  Review of Systems  Constitutional: Negative for fever and unexpected weight change.  HENT: Positive for postnasal drip.   Respiratory: Negative for wheezing.   Cardiovascular: Negative for chest pain.  Gastrointestinal: Positive for abdominal pain (LLQ, 1-2 years, achy but sharp with pressure). Negative for blood in stool.  Endocrine: Negative for polydipsia and polyuria.  Genitourinary: Negative for hematuria.  Musculoskeletal: Negative for joint swelling.  Allergic/Immunologic: Negative for food allergies.  Neurological: Negative for tremors.  Hematological: Negative for adenopathy. Does not bruise/bleed easily.  Psychiatric/Behavioral: Negative for dysphoric mood.       Work stress   Per HPI unless specifically indicated above     Objective:    BP 132/84   Pulse 60   Temp 98.2 F (36.8 C) (Oral)   Resp 12   Ht 5' 10"  (1.778 m)   Wt 231 lb (104.8 kg)   SpO2 95%   BMI 33.15 kg/m   Wt Readings from Last 3 Encounters:  02/06/19 231 lb (104.8 kg)  01/29/19 230 lb (104.3 kg)  01/01/19 238 lb 9.6 oz (108.2 kg)    Physical Exam Constitutional:      General: He is  not in acute distress.    Appearance: He is well-developed. He is not diaphoretic.  HENT:     Head: Normocephalic and atraumatic.     Nose: Nose normal.  Eyes:  General: No scleral icterus. Neck:     Thyroid: No thyromegaly.     Vascular: No JVD.  Cardiovascular:     Rate and Rhythm: Normal rate and regular rhythm.     Heart sounds: Normal heart sounds.  Pulmonary:     Effort: Pulmonary effort is normal. No respiratory distress.     Breath sounds: Normal breath sounds. No wheezing or rales.  Abdominal:     General: Bowel sounds are normal. There is no distension.     Palpations: Abdomen is soft.     Tenderness: There is no abdominal tenderness. There is no guarding.  Musculoskeletal: Normal range of motion.  Lymphadenopathy:     Cervical: No cervical adenopathy.  Skin:    General: Skin is warm and dry.     Coloration: Skin is not pale.     Findings: No erythema or rash.  Neurological:     Mental Status: He is alert.     Motor: No abnormal muscle tone.     Coordination: Coordination normal.     Deep Tendon Reflexes: Reflexes normal.  Psychiatric:        Behavior: Behavior normal.        Thought Content: Thought content normal.        Judgment: Judgment normal.     Results for orders placed or performed in visit on 12/22/18  Iron, TIBC and Ferritin Panel  Result Value Ref Range   Iron 112   CBC and differential  Result Value Ref Range   Hemoglobin 14.6 13.5 - 17.5   HCT 43 41 - 53   WBC 5.3   Basic metabolic panel  Result Value Ref Range   Glucose 98    BUN 11 4 - 21   Creatinine 0.8 0.6 - 1.3   Potassium 4.2 3.4 - 5.3   Sodium 142 137 - 147  Lipid panel  Result Value Ref Range   LDl/HDL Ratio 4.7    Triglycerides 116 40 - 160   Cholesterol 212 (A) 0 - 200   HDL 45 35 - 70   LDL Cholesterol 144   Hepatic function panel  Result Value Ref Range   ALT 35 10 - 40   AST 19 14 - 40  PSA  Result Value Ref Range   PSA 0.4   TSH  Result Value Ref Range    TSH 1.65 0.41 - 5.90      Assessment & Plan:   Problem List Items Addressed This Visit      Other   Preventative health care - Primary    USPSTF grade A and B recommendations reviewed with patient; age-appropriate recommendations, preventive care, screening tests, etc discussed and encouraged; healthy living encouraged; see AVS for patient education given to patient       Obesity (BMI 30.0-34.9)    Recommended weight loss; offered referral to nutritionist which patient politely declined       Other Visit Diagnoses    Left lower quadrant abdominal pain       patient reports 1-2 year duration of LLQ pain; I think he was reluctant to bring this up since there might be a separate E/M charge; I do want to get a CT scan   Relevant Orders   CT Abdomen Pelvis W Contrast       Follow up plan: Return in about 1 year (around 02/07/2020) for complete physical.  An after-visit summary was printed and given to the patient at [facility].  Please see  the patient instructions which may contain other information and recommendations beyond what is mentioned above in the assessment and plan.  No orders of the defined types were placed in this encounter.   Orders Placed This Encounter  Procedures  . CT Abdomen Pelvis W Contrast

## 2019-02-06 NOTE — Assessment & Plan Note (Addendum)
Recommended weight loss; offered referral to nutritionist which patient politely declined

## 2019-02-06 NOTE — H&P (View-Only) (Signed)
BP 132/84   Pulse 60   Temp 98.2 F (36.8 C) (Oral)   Resp 12   Ht 5' 10"  (1.778 m)   Wt 231 lb (104.8 kg)   SpO2 95%   BMI 33.15 kg/m    Subjective:    Patient ID: Martin Parks, male    DOB: [birth year], 44 y.o.   MRN: [id number]  HPI: Martin Parks is a 44 y.o. male  Chief Complaint  Patient presents with  . Annual Exam    HPI Patient is here for a physical  Stressed a fair amount; did go to counseling; sees Dr. Toy Care in [place], doing okay; responsibilities at work and home  USPSTF grade A and B recommendations Depression:  Depression screen [facility] 2/9 02/06/2019 12/22/2018 03/07/2016  Decreased Interest 0 0 0  Down, Depressed, Hopeless 0 1 0  PHQ - 2 Score 0 1 0  Altered sleeping 0 2 1  Tired, decreased energy 0 2 1  Change in appetite 0 1 0  Feeling bad or failure about yourself  0 0 0  Trouble concentrating 0 0 0  Moving slowly or fidgety/restless 0 0 0  Suicidal thoughts 0 0 0  PHQ-9 Score 0 6 2  Difficult doing work/chores Not difficult at all Somewhat difficult -   Hypertension: BP Readings from Last 3 Encounters:  02/06/19 132/84  01/29/19 (!) 138/95  01/01/19 (!) 137/98   Obesity: Wt Readings from Last 3 Encounters:  02/06/19 231 lb (104.8 kg)  01/29/19 230 lb (104.3 kg)  01/01/19 238 lb 9.6 oz (108.2 kg)   BMI Readings from Last 3 Encounters:  02/06/19 33.15 kg/m  01/29/19 32.08 kg/m  01/01/19 33.99 kg/m    Immunizations: UTD tetanus; shingrix at age 70 recommended; does not want flu shot  Skin cancer: wants to see dermatologist, used to see derm and she left; saw Dr. Phillip Heal and would like to see another; gave name; he'll call his own; nothing worrisome; just had one removed from back last summer; watching, lifeguard and farmer for years;redness on the cheek, blue light on monitors Lung cancer:  Nonsmoker; just social in college Prostate cancer: not in the family Lab Results  Component Value Date   PSA 0.4 09/25/2018    Colorectal cancer: no fam hx; start at age 73 AAA: n/a Aspirin: n/a Diet: good eater Exercise: schedule is conflicting with exercise; likes to be active, not formal gym Alcohol:    Office Visit from 02/06/2019 in [facility]  AUDIT-C Score  0     Tobacco use: non HIV, hep B, hep C: not intersted STD testing and prevention (chl/gon/syphilis): not interested Glucose:  Glucose  Date Value Ref Range Status  03/07/2016 80 65 - 99 mg/dL Final  01/27/2013 128 (H) 65 - 99 mg/dL Final   Glucose, Bld  Date Value Ref Range Status  12/22/2018 81 65 - 139 mg/dL Final    Comment:    .        Non-fasting reference interval .    Lipids:  Lab Results  Component Value Date   CHOL 222 (H) 12/22/2018   CHOL 212 (A) 09/25/2018   CHOL 226 (H) 03/07/2016   Lab Results  Component Value Date   HDL 42 12/22/2018   HDL 45 09/25/2018   HDL 36 (L) 03/07/2016   Lab Results  Component Value Date   LDLCALC 144 (H) 12/22/2018   LDLCALC 144 09/25/2018   LDLCALC 119 (H) 03/07/2016   Lab Results  Component Value Date   TRIG 222 (H) 12/22/2018   TRIG 116 09/25/2018   TRIG 353 (H) 03/07/2016   Lab Results  Component Value Date   CHOLHDL 5.3 (H) 12/22/2018   No results found for: LDLDIRECT   Depression screen [facility] 2/9 02/06/2019 12/22/2018 03/07/2016  Decreased Interest 0 0 0  Down, Depressed, Hopeless 0 1 0  PHQ - 2 Score 0 1 0  Altered sleeping 0 2 1  Tired, decreased energy 0 2 1  Change in appetite 0 1 0  Feeling bad or failure about yourself  0 0 0  Trouble concentrating 0 0 0  Moving slowly or fidgety/restless 0 0 0  Suicidal thoughts 0 0 0  PHQ-9 Score 0 6 2  Difficult doing work/chores Not difficult at all Somewhat difficult -   Fall Risk  02/06/2019 01/01/2019 12/22/2018 03/07/2016  Falls in the past year? 0 0 0 No  Number falls in past yr: 0 0 0 -  Injury with Fall? 0 0 0 -    Relevant past medical, surgical, family and social history reviewed Past  Medical History:  Diagnosis Date  . Anxiety   . Depression   . GERD (gastroesophageal reflux disease)   . Hypertension    Past Surgical History:  Procedure Laterality Date  . HERNIA REPAIR    . NO PAST SURGERIES    . TONSILLECTOMY     Family History  Problem Relation Age of Onset  . Cancer Sister        breast  sister in her 69's; works for [employer], in and out of airports, with phone in the front chest pocket  Social History   Tobacco Use  . Smoking status: Never Smoker  . Smokeless tobacco: Never Used  Substance Use Topics  . Alcohol use: Never    Alcohol/week: 0.0 standard drinks    Frequency: Never  . Drug use: No     [facility] Visit from 02/06/2019 in [facility]  AUDIT-C Score  0      Interim medical history since last visit reviewed. Allergies and medications reviewed  Review of Systems  Constitutional: Negative for fever and unexpected weight change.  HENT: Positive for postnasal drip.   Respiratory: Negative for wheezing.   Cardiovascular: Negative for chest pain.  Gastrointestinal: Positive for abdominal pain (LLQ, 1-2 years, achy but sharp with pressure). Negative for blood in stool.  Endocrine: Negative for polydipsia and polyuria.  Genitourinary: Negative for hematuria.  Musculoskeletal: Negative for joint swelling.  Allergic/Immunologic: Negative for food allergies.  Neurological: Negative for tremors.  Hematological: Negative for adenopathy. Does not bruise/bleed easily.  Psychiatric/Behavioral: Negative for dysphoric mood.       Work stress   Per HPI unless specifically indicated above     Objective:    BP 132/84   Pulse 60   Temp 98.2 F (36.8 C) (Oral)   Resp 12   Ht 5' 10"  (1.778 m)   Wt 231 lb (104.8 kg)   SpO2 95%   BMI 33.15 kg/m   Wt Readings from Last 3 Encounters:  02/06/19 231 lb (104.8 kg)  01/29/19 230 lb (104.3 kg)  01/01/19 238 lb 9.6 oz (108.2 kg)    Physical Exam Constitutional:      General: He is  not in acute distress.    Appearance: He is well-developed. He is not diaphoretic.  HENT:     Head: Normocephalic and atraumatic.     Nose: Nose normal.  Eyes:  General: No scleral icterus. Neck:     Thyroid: No thyromegaly.     Vascular: No JVD.  Cardiovascular:     Rate and Rhythm: Normal rate and regular rhythm.     Heart sounds: Normal heart sounds.  Pulmonary:     Effort: Pulmonary effort is normal. No respiratory distress.     Breath sounds: Normal breath sounds. No wheezing or rales.  Abdominal:     General: Bowel sounds are normal. There is no distension.     Palpations: Abdomen is soft.     Tenderness: There is no abdominal tenderness. There is no guarding.  Musculoskeletal: Normal range of motion.  Lymphadenopathy:     Cervical: No cervical adenopathy.  Skin:    General: Skin is warm and dry.     Coloration: Skin is not pale.     Findings: No erythema or rash.  Neurological:     Mental Status: He is alert.     Motor: No abnormal muscle tone.     Coordination: Coordination normal.     Deep Tendon Reflexes: Reflexes normal.  Psychiatric:        Behavior: Behavior normal.        Thought Content: Thought content normal.        Judgment: Judgment normal.     Results for orders placed or performed in visit on 12/22/18  Iron, TIBC and Ferritin Panel  Result Value Ref Range   Iron 112   CBC and differential  Result Value Ref Range   Hemoglobin 14.6 13.5 - 17.5   HCT 43 41 - 53   WBC 5.3   Basic metabolic panel  Result Value Ref Range   Glucose 98    BUN 11 4 - 21   Creatinine 0.8 0.6 - 1.3   Potassium 4.2 3.4 - 5.3   Sodium 142 137 - 147  Lipid panel  Result Value Ref Range   LDl/HDL Ratio 4.7    Triglycerides 116 40 - 160   Cholesterol 212 (A) 0 - 200   HDL 45 35 - 70   LDL Cholesterol 144   Hepatic function panel  Result Value Ref Range   ALT 35 10 - 40   AST 19 14 - 40  PSA  Result Value Ref Range   PSA 0.4   TSH  Result Value Ref Range    TSH 1.65 0.41 - 5.90      Assessment & Plan:   Problem List Items Addressed This Visit      Other   Preventative health care - Primary    USPSTF grade A and B recommendations reviewed with patient; age-appropriate recommendations, preventive care, screening tests, etc discussed and encouraged; healthy living encouraged; see AVS for patient education given to patient       Obesity (BMI 30.0-34.9)    Recommended weight loss; offered referral to nutritionist which patient politely declined       Other Visit Diagnoses    Left lower quadrant abdominal pain       patient reports 1-2 year duration of LLQ pain; I think he was reluctant to bring this up since there might be a separate E/M charge; I do want to get a CT scan   Relevant Orders   CT Abdomen Pelvis W Contrast       Follow up plan: Return in about 1 year (around 02/07/2020) for complete physical.  An after-visit summary was printed and given to the patient at [facility].  Please see  the patient instructions which may contain other information and recommendations beyond what is mentioned above in the assessment and plan.  No orders of the defined types were placed in this encounter.   Orders Placed This Encounter  Procedures  . CT Abdomen Pelvis W Contrast

## 2019-02-06 NOTE — Patient Instructions (Addendum)
I do recommend yearly flu shots; for individuals who don't want flu shots, try to practice excellent hand hygiene, and avoid nursing homes, day cares, and hospitals during peak flu season; taking additional vitamin C daily during flu/cold season may help boost your immune system too  Check out the information at familydoctor.org entitled "Nutrition for Weight Loss: What You Need to Know about Fad Diets" Try to lose between 1-2 pounds per week by taking in fewer calories and burning off more calories You can succeed by limiting portions, limiting foods dense in calories and fat, becoming more active, and drinking 8 glasses of water a day (64 ounces) Don't skip meals, especially breakfast, as skipping meals may alter your metabolism Do not use over-the-counter weight loss pills or gimmicks that claim rapid weight loss A healthy BMI (or body mass index) is between 18.5 and 24.9 You can calculate your ideal BMI at the [place] website ClubMon[employer]fr  Try to follow the DASH guidelines (DASH stands for Dietary Approaches to Stop Hypertension). Try to limit the sodium in your diet to no more than 1,572m of sodium per day. Certainly try to not exceed 2,000 mg per day at the very most. Do not add salt when cooking or at the table.  Check the sodium amount on labels when shopping, and choose items lower in sodium when given a choice. Avoid or limit foods that already contain a lot of sodium. Eat a diet rich in fruits and vegetables and whole grains, and try to lose weight if overweight or obese   Health Maintenance, Male A healthy lifestyle and preventive care is important for your health and wellness. Ask your health care provider about what schedule of regular examinations is right for you. What should I know about weight and diet? Eat a Healthy Diet  Eat plenty of vegetables, fruits, whole grains, low-fat dairy products, and lean protein.  Do not eat a  lot of foods high in solid fats, added sugars, or salt.  Maintain a Healthy Weight Regular exercise can help you achieve or maintain a healthy weight. You should:  Do at least 150 minutes of exercise each week. The exercise should increase your heart rate and make you sweat (moderate-intensity exercise).  Do strength-training exercises at least twice a week. Watch Your Levels of Cholesterol and Blood Lipids  Have your blood tested for lipids and cholesterol every 5 years starting at 43years of age. If you are at high risk for heart disease, you should start having your blood tested when you are 43years old. You may need to have your cholesterol levels checked more often if: ? Your lipid or cholesterol levels are high. ? You are older than 43years of age. ? You are at high risk for heart disease. What should I know about cancer screening? Many types of cancers can be detected early and may often be prevented. Lung Cancer  You should be screened every year for lung cancer if: ? You are a current smoker who has smoked for at least 30 years. ? You are a former smoker who has quit within the past 15 years.  Talk to your health care provider about your screening options, when you should start screening, and how often you should be screened. Colorectal Cancer  Routine colorectal cancer screening usually begins at 43years of age and should be repeated every 5-10 years until you are 43years old. You may need to be screened more often if early forms of precancerous  polyps or small growths are found. Your health care provider may recommend screening at an earlier age if you have risk factors for colon cancer.  Your health care provider may recommend using home test kits to check for hidden blood in the stool.  A small camera at the end of a tube can be used to examine your colon (sigmoidoscopy or colonoscopy). This checks for the earliest forms of colorectal cancer. Prostate and Testicular  Cancer  Depending on your age and overall health, your health care provider may do certain tests to screen for prostate and testicular cancer.  Talk to your health care provider about any symptoms or concerns you have about testicular or prostate cancer. Skin Cancer  Check your skin from head to toe regularly.  Tell your health care provider about any new moles or changes in moles, especially if: ? There is a change in a mole's size, shape, or color. ? You have a mole that is larger than a pencil eraser.  Always use sunscreen. Apply sunscreen liberally and repeat throughout the day.  Protect yourself by wearing long sleeves, pants, a wide-brimmed hat, and sunglasses when outside. What should I know about heart disease, diabetes, and high blood pressure?  If you are 45-12 years of age, have your blood pressure checked every 3-5 years. If you are 29 years of age or older, have your blood pressure checked every year. You should have your blood pressure measured twice-once when you are at a hospital or clinic, and once when you are not at a hospital or clinic. Record the average of the two measurements. To check your blood pressure when you are not at a hospital or clinic, you can use: ? An automated blood pressure machine at a pharmacy. ? A home blood pressure monitor.  Talk to your health care provider about your target blood pressure.  If you are between 55-63 years old, ask your health care provider if you should take aspirin to prevent heart disease.  Have regular diabetes screenings by checking your fasting blood sugar level. ? If you are at a normal weight and have a low risk for diabetes, have this test once every three years after the age of 52. ? If you are overweight and have a high risk for diabetes, consider being tested at a younger age or more often.  A one-time screening for abdominal aortic aneurysm (AAA) by ultrasound is recommended for men aged 76-75 years who are current  or former smokers. What should I know about preventing infection? Hepatitis B If you have a higher risk for hepatitis B, you should be screened for this virus. Talk with your health care provider to find out if you are at risk for hepatitis B infection. Hepatitis C Blood testing is recommended for:  Everyone born from [birth year] through 1964.  Anyone with known risk factors for hepatitis C. Sexually Transmitted Diseases (STDs)  You should be screened each year for STDs including gonorrhea and chlamydia if: ? You are sexually active and are younger than 43 years of age. ? You are older than 43 years of age and your health care provider tells you that you are at risk for this type of infection. ? Your sexual activity has changed since you were last screened and you are at an increased risk for chlamydia or gonorrhea. Ask your health care provider if you are at risk.  Talk with your health care provider about whether you are at high risk of being infected  with HIV. Your health care provider may recommend a prescription medicine to help prevent HIV infection. What else can I do?  Schedule regular health, dental, and eye exams.  Stay current with your vaccines (immunizations).  Do not use any tobacco products, such as cigarettes, chewing tobacco, and e-cigarettes. If you need help quitting, ask your health care provider.  Limit alcohol intake to no more than 2 drinks per day. One drink equals 12 ounces of beer, 5 ounces of wine, or 1 ounces of hard liquor.  Do not use street drugs.  Do not share needles.  Ask your health care provider for help if you need support or information about quitting drugs.  Tell your health care provider if you often feel depressed.  Tell your health care provider if you have ever been abused or do not feel safe at home. This information is not intended to replace advice given to you by your health care provider. Make sure you discuss any questions you have with  your health care provider. Document Released: 06/14/2008 Document Revised: 08/15/2016 Document Reviewed: 09/20/2015 Elsevier Interactive Patient Education  2019 [employer].

## 2019-02-08 DIAGNOSIS — Z0000 Encounter for general adult medical examination without abnormal findings: Secondary | ICD-10-CM | POA: Insufficient documentation

## 2019-02-08 NOTE — Assessment & Plan Note (Signed)
USPSTF grade A and B recommendations reviewed with patient; age-appropriate recommendations, preventive care, screening tests, etc discussed and encouraged; healthy living encouraged; see AVS for patient education given to patient

## 2019-02-11 ENCOUNTER — Telehealth: Payer: Self-pay | Admitting: *Deleted

## 2019-02-11 NOTE — Telephone Encounter (Signed)
After speaking with the patient and further review of the schedule, we will plan for surgery on 03-02-19 instead of 02-27-19 with Dr. Rosana Hoes.   Patient will not need a pre-op visit per Dr. Rosana Hoes. History and physical will be updated the morning of procedure.   Surgery instructions reviewed with the patient by phone today.   He is aware to call the office should he have further questions.   Patient verbalizes understanding.

## 2019-02-11 NOTE — Telephone Encounter (Signed)
Message left for patient to call the office.   I had a message left on my desk from Tuesday, 02-10-19 that patient is ready to get surgery scheduled.   Patient had provided a list of possible dates on his end.   We can get patient's surgery scheduled for 02-27-19 at present. Will await patient's phone call before scheduling with [facility].   Secure chat message also sent to Dr. Rosana Hoes to see if he would like to see patient for a pre-op visit. Last seen by Dr. Rosana Hoes on 01-01-19. Still awaiting a response from Dr. Rosana Hoes.

## 2019-02-12 ENCOUNTER — Telehealth: Payer: Self-pay | Admitting: *Deleted

## 2019-02-12 NOTE — Telephone Encounter (Signed)
Patient called the office wanting to know how long he could expect to be out of work.   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   Patient was told to plan for about 2 weeks. He may not have the stamina to return to work sooner. He was also told he could do half days at work.   The patient verbalizes understanding.

## 2019-02-16 ENCOUNTER — Ambulatory Visit
Admission: RE | Admit: 2019-02-16 | Discharge: 2019-02-16 | Disposition: A | Discharge status: Home / Self Care | Payer: Commercial Managed Care - PPO | Source: Ambulatory Visit | Attending: Family Medicine | Admitting: Family Medicine

## 2019-02-16 DIAGNOSIS — R1032 Left lower quadrant pain: Secondary | ICD-10-CM | POA: Insufficient documentation

## 2019-02-16 DIAGNOSIS — N200 Calculus of kidney: Secondary | ICD-10-CM | POA: Diagnosis not present

## 2019-02-16 DIAGNOSIS — K429 Umbilical hernia without obstruction or gangrene: Secondary | ICD-10-CM | POA: Diagnosis not present

## 2019-02-16 MED ORDER — IOPAMIDOL (ISOVUE-300) INJECTION 61%
100.0000 mL | Freq: Once | INTRAVENOUS | Status: AC | PRN
  Administered 2019-02-16: 100 mL via INTRAVENOUS

## 2019-02-19 ENCOUNTER — Encounter
Admission: RE | Admit: 2019-02-19 | Discharge: 2019-02-19 | Disposition: A | Discharge status: Home / Self Care | Payer: Commercial Managed Care - PPO | Source: Ambulatory Visit | Attending: Surgery | Admitting: Surgery

## 2019-02-19 ENCOUNTER — Other Ambulatory Visit: Payer: Self-pay

## 2019-02-19 HISTORY — DX: Adverse effect of unspecified anesthetic, initial encounter: T41.45XA

## 2019-02-19 HISTORY — DX: Headache: R51

## 2019-02-19 HISTORY — DX: Pneumonia, unspecified organism: J18.9

## 2019-02-19 HISTORY — DX: Personal history of urinary calculi: Z87.442

## 2019-02-19 HISTORY — DX: Headache, unspecified: R51.9

## 2019-02-19 HISTORY — DX: Other complications of anesthesia, initial encounter: T88.59XA

## 2019-02-19 NOTE — Patient Instructions (Signed)
Your procedure is scheduled on: 03-02-19 Report to Same Day Surgery 2nd floor medical mall [facility] Entrance-take elevator on left to 2nd floor.  Check in with surgery information desk.) To find out your arrival time please call [phone] between 1PM - 3PM on 02-27-19  Remember: Instructions that are not followed completely may result in serious medical risk, up to and including death, or upon the discretion of your surgeon and anesthesiologist your surgery may need to be rescheduled.    _x___ 1. Do not eat food after midnight the night before your procedure. You may drink clear liquids up to 2 hours before you are scheduled to arrive at the hospital for your procedure.  Do not drink clear liquids within 2 hours of your scheduled arrival to the hospital.  Clear liquids include  --Water or Apple juice without pulp  --Clear carbohydrate beverage such as ClearFast or Gatorade  --Black Coffee or Clear Tea (No milk, no creamers, do not add anything to the coffee or Tea   ____Ensure clear carbohydrate drink on the way to the hospital for bariatric patients  ____Ensure clear carbohydrate drink 3 hours before surgery for Dr Dwyane Luo patients if physician instructed.   No gum chewing or hard candies.     __x__ 2. No Alcohol for 24 hours before or after surgery.   __x__3. No Smoking or e-cigarettes for 24 prior to surgery.  Do not use any chewable tobacco products for at least 6 hour prior to surgery   ____  4. Bring all medications with you on the day of surgery if instructed.    __x__ 5. Notify your doctor if there is any change in your medical condition     (cold, fever, infections).    x___6. On the morning of surgery brush your teeth with toothpaste and water.  You may rinse your mouth with mouth wash if you wish.  Do not swallow any toothpaste or mouthwash.   Do not wear jewelry, make-up, hairpins, clips or nail polish.  Do not wear lotions, powders, or perfumes. You may wear  deodorant.  Do not shave 48 hours prior to surgery. Men may shave face and neck.  Do not bring valuables to the hospital.    [facility] is not responsible for any belongings or valuables.               Contacts, dentures or bridgework may not be worn into surgery.  Leave your suitcase in the car. After surgery it may be brought to your room.  For patients admitted to the hospital, discharge time is determined by your treatment team.  _  Patients discharged the day of surgery will not be allowed to drive home.  You will need someone to drive you home and stay with you the night of your procedure.    Please read over the following fact sheets that you were given:   [facility] Preparing for Surgery   _x___ TAKE THE FOLLOWING MEDICATION THE MORNING OF SURGERY WITH A SMALL SIP OF WATER. These include:  1. PRISTIQ  2. YOU MAY TAKE XANAX DAY OF SURGERY IF NEEDED  3.  4.  5.  6.  ____Fleets enema or Magnesium Citrate as directed.   ____ Use CHG Soap or sage wipes as directed on instruction sheet   ____ Use inhalers on the day of surgery and bring to hospital day of surgery  ____ Stop Metformin and Janumet 2 days prior to surgery.    ____ Take  1/2 of usual insulin dose the night before surgery and none on the morning surgery.   ____ Follow recommendations from Cardiologist, Pulmonologist or PCP regarding  stopping Aspirin, Coumadin, Plavix ,Eliquis, Effient, or Pradaxa, and Pletal.  X____Stop Anti-inflammatories such as Advil, Aleve, Ibuprofen, Motrin, Naproxen, Naprosyn, Goodies powders or aspirin products 7 DAYS PRIOR TO SURGERY- OK to take Tylenol    _x___ Stop supplements until after surgery-STOP TURMERIC, FISH OIL AND OSTEO BI-FLEX NOW-MAY RESUME AFTER SURGERY   ____ Bring C-Pap to the hospital.

## 2019-03-01 ENCOUNTER — Encounter: Payer: Self-pay | Admitting: Anesthesiology

## 2019-03-02 ENCOUNTER — Encounter: Payer: Self-pay | Admitting: *Deleted

## 2019-03-02 ENCOUNTER — Ambulatory Visit: Payer: Commercial Managed Care - PPO | Admitting: Anesthesiology

## 2019-03-02 ENCOUNTER — Encounter
Admission: RE | Disposition: A | Discharge status: Home / Self Care | Payer: Self-pay | Source: Home / Self Care | Attending: Surgery

## 2019-03-02 ENCOUNTER — Ambulatory Visit
Admission: RE | Admit: 2019-03-02 | Discharge: 2019-03-02 | Disposition: A | Discharge status: Home / Self Care | Payer: Commercial Managed Care - PPO | Attending: Surgery | Admitting: Surgery

## 2019-03-02 ENCOUNTER — Other Ambulatory Visit: Payer: Self-pay

## 2019-03-02 DIAGNOSIS — E669 Obesity, unspecified: Secondary | ICD-10-CM | POA: Insufficient documentation

## 2019-03-02 DIAGNOSIS — I10 Essential (primary) hypertension: Secondary | ICD-10-CM | POA: Diagnosis not present

## 2019-03-02 DIAGNOSIS — Z79899 Other long term (current) drug therapy: Secondary | ICD-10-CM | POA: Insufficient documentation

## 2019-03-02 DIAGNOSIS — K219 Gastro-esophageal reflux disease without esophagitis: Secondary | ICD-10-CM | POA: Insufficient documentation

## 2019-03-02 DIAGNOSIS — F419 Anxiety disorder, unspecified: Secondary | ICD-10-CM | POA: Diagnosis not present

## 2019-03-02 DIAGNOSIS — Z7951 Long term (current) use of inhaled steroids: Secondary | ICD-10-CM | POA: Diagnosis not present

## 2019-03-02 DIAGNOSIS — F329 Major depressive disorder, single episode, unspecified: Secondary | ICD-10-CM | POA: Insufficient documentation

## 2019-03-02 DIAGNOSIS — Z6831 Body mass index (BMI) 31.0-31.9, adult: Secondary | ICD-10-CM | POA: Diagnosis not present

## 2019-03-02 DIAGNOSIS — K429 Umbilical hernia without obstruction or gangrene: Secondary | ICD-10-CM

## 2019-03-02 HISTORY — PX: UMBILICAL HERNIA REPAIR: SHX196

## 2019-03-02 SURGERY — REPAIR, HERNIA, UMBILICAL, ADULT
Anesthesia: General

## 2019-03-02 MED ORDER — PROPOFOL 10 MG/ML IV BOLUS
INTRAVENOUS | Status: DC | PRN
  Administered 2019-03-02: 150 mg via INTRAVENOUS

## 2019-03-02 MED ORDER — CEFAZOLIN SODIUM-DEXTROSE 2-4 GM/100ML-% IV SOLN
INTRAVENOUS | Status: AC
  Filled 2019-03-02: qty 100

## 2019-03-02 MED ORDER — ONDANSETRON HCL 4 MG/2ML IJ SOLN
INTRAMUSCULAR | Status: DC | PRN
  Administered 2019-03-02: 4 mg via INTRAVENOUS

## 2019-03-02 MED ORDER — ONDANSETRON HCL 4 MG/2ML IJ SOLN
INTRAMUSCULAR | Status: AC
  Filled 2019-03-02: qty 2

## 2019-03-02 MED ORDER — FENTANYL CITRATE (PF) 100 MCG/2ML IJ SOLN
25.0000 ug | INTRAMUSCULAR | Status: DC | PRN
  Administered 2019-03-02 (×2): 25 ug via INTRAVENOUS

## 2019-03-02 MED ORDER — PROPOFOL 10 MG/ML IV BOLUS
INTRAVENOUS | Status: AC
  Filled 2019-03-02: qty 20

## 2019-03-02 MED ORDER — FAMOTIDINE 20 MG PO TABS
20.0000 mg | ORAL_TABLET | Freq: Once | ORAL | Status: AC
  Administered 2019-03-02: 20 mg via ORAL

## 2019-03-02 MED ORDER — LACTATED RINGERS IV SOLN
INTRAVENOUS | Status: DC | PRN
  Administered 2019-03-02: 08:00:00 via INTRAVENOUS

## 2019-03-02 MED ORDER — MIDAZOLAM HCL 2 MG/2ML IJ SOLN
INTRAMUSCULAR | Status: DC | PRN
  Administered 2019-03-02: 2 mg via INTRAVENOUS

## 2019-03-02 MED ORDER — ALBUTEROL SULFATE HFA 108 (90 BASE) MCG/ACT IN AERS
INHALATION_SPRAY | RESPIRATORY_TRACT | Status: AC
  Filled 2019-03-02: qty 6.7

## 2019-03-02 MED ORDER — KETOROLAC TROMETHAMINE 30 MG/ML IJ SOLN
INTRAMUSCULAR | Status: DC | PRN
  Administered 2019-03-02: 30 mg via INTRAVENOUS

## 2019-03-02 MED ORDER — ROCURONIUM BROMIDE 100 MG/10ML IV SOLN
INTRAVENOUS | Status: DC | PRN
  Administered 2019-03-02: 20 mg via INTRAVENOUS
  Administered 2019-03-02: 30 mg via INTRAVENOUS

## 2019-03-02 MED ORDER — LACTATED RINGERS IV SOLN
INTRAVENOUS | Status: DC
  Administered 2019-03-02: 07:00:00 via INTRAVENOUS

## 2019-03-02 MED ORDER — FAMOTIDINE 20 MG PO TABS
ORAL_TABLET | ORAL | Status: AC
  Administered 2019-03-02: 20 mg via ORAL
  Filled 2019-03-02: qty 1

## 2019-03-02 MED ORDER — GABAPENTIN 300 MG PO CAPS
ORAL_CAPSULE | ORAL | Status: AC
  Administered 2019-03-02: 300 mg via ORAL
  Filled 2019-03-02: qty 1

## 2019-03-02 MED ORDER — LIDOCAINE HCL (PF) 1 % IJ SOLN
INTRAMUSCULAR | Status: AC
  Filled 2019-03-02: qty 30

## 2019-03-02 MED ORDER — OXYCODONE-ACETAMINOPHEN 5-325 MG PO TABS: 1.0000 | ORAL_TABLET | ORAL | 0 refills | Status: DC | PRN

## 2019-03-02 MED ORDER — SUGAMMADEX SODIUM 200 MG/2ML IV SOLN
INTRAVENOUS | Status: DC | PRN
  Administered 2019-03-02: 200 mg via INTRAVENOUS

## 2019-03-02 MED ORDER — GABAPENTIN 300 MG PO CAPS
300.0000 mg | ORAL_CAPSULE | ORAL | Status: AC
  Administered 2019-03-02: 300 mg via ORAL

## 2019-03-02 MED ORDER — CHLORHEXIDINE GLUCONATE CLOTH 2 % EX PADS: 6.0000 | MEDICATED_PAD | Freq: Once | CUTANEOUS | Status: DC

## 2019-03-02 MED ORDER — ALBUTEROL SULFATE HFA 108 (90 BASE) MCG/ACT IN AERS
INHALATION_SPRAY | RESPIRATORY_TRACT | Status: DC | PRN
  Administered 2019-03-02: 4 via RESPIRATORY_TRACT

## 2019-03-02 MED ORDER — FENTANYL CITRATE (PF) 250 MCG/5ML IJ SOLN
INTRAMUSCULAR | Status: AC
  Filled 2019-03-02: qty 5

## 2019-03-02 MED ORDER — ACETAMINOPHEN 10 MG/ML IV SOLN
INTRAVENOUS | Status: AC
  Filled 2019-03-02: qty 100

## 2019-03-02 MED ORDER — ACETAMINOPHEN 500 MG PO TABS
1000.0000 mg | ORAL_TABLET | ORAL | Status: AC
  Administered 2019-03-02: 1000 mg via ORAL

## 2019-03-02 MED ORDER — FENTANYL CITRATE (PF) 100 MCG/2ML IJ SOLN
INTRAMUSCULAR | Status: AC
  Administered 2019-03-02: 25 ug via INTRAVENOUS
  Filled 2019-03-02: qty 2

## 2019-03-02 MED ORDER — CEFAZOLIN SODIUM-DEXTROSE 2-4 GM/100ML-% IV SOLN
2.0000 g | INTRAVENOUS | Status: AC
  Administered 2019-03-02: 2 g via INTRAVENOUS

## 2019-03-02 MED ORDER — ACETAMINOPHEN 500 MG PO TABS
ORAL_TABLET | ORAL | Status: AC
  Administered 2019-03-02: 1000 mg via ORAL
  Filled 2019-03-02: qty 1

## 2019-03-02 MED ORDER — LIDOCAINE HCL (CARDIAC) PF 100 MG/5ML IV SOSY
PREFILLED_SYRINGE | INTRAVENOUS | Status: DC | PRN
  Administered 2019-03-02: 100 mg via INTRAVENOUS

## 2019-03-02 MED ORDER — FENTANYL CITRATE (PF) 100 MCG/2ML IJ SOLN
INTRAMUSCULAR | Status: DC | PRN
  Administered 2019-03-02 (×2): 50 ug via INTRAVENOUS

## 2019-03-02 MED ORDER — DEXAMETHASONE SODIUM PHOSPHATE 10 MG/ML IJ SOLN
INTRAMUSCULAR | Status: DC | PRN
  Administered 2019-03-02: 10 mg via INTRAVENOUS

## 2019-03-02 MED ORDER — BUPIVACAINE HCL (PF) 0.5 % IJ SOLN
INTRAMUSCULAR | Status: AC
  Filled 2019-03-02: qty 30

## 2019-03-02 MED ORDER — ONDANSETRON HCL 4 MG/2ML IJ SOLN: 4.0000 mg | Freq: Once | INTRAMUSCULAR | Status: DC | PRN

## 2019-03-02 MED ORDER — HYDROMORPHONE HCL 1 MG/ML IJ SOLN
INTRAMUSCULAR | Status: AC
  Filled 2019-03-02: qty 1

## 2019-03-02 MED ORDER — LIDOCAINE HCL 1 % IJ SOLN
INTRAMUSCULAR | Status: DC | PRN
  Administered 2019-03-02: 20 mL

## 2019-03-02 MED ORDER — MIDAZOLAM HCL 2 MG/2ML IJ SOLN
INTRAMUSCULAR | Status: AC
  Filled 2019-03-02: qty 2

## 2019-03-02 SURGICAL SUPPLY — 30 items
BLADE CLIPPER SURG (BLADE) ×3 IMPLANT
BLADE SURG 15 STRL LF DISP TIS (BLADE) ×2 IMPLANT
BLADE SURG 15 STRL SS (BLADE) ×4
CHLORAPREP W/TINT 26ML (MISCELLANEOUS) ×3 IMPLANT
COVER WAND RF STERILE (DRAPES) IMPLANT
DECANTER SPIKE VIAL GLASS SM (MISCELLANEOUS) ×3 IMPLANT
DERMABOND ADVANCED (GAUZE/BANDAGES/DRESSINGS) ×2
DERMABOND ADVANCED .7 DNX12 (GAUZE/BANDAGES/DRESSINGS) ×1 IMPLANT
DRAIN PENROSE 1/4X12 LTX (DRAIN) IMPLANT
DRAPE LAPAROTOMY 77X122 PED (DRAPES) ×3 IMPLANT
ELECT CAUTERY BLADE 6.4 (BLADE) ×3 IMPLANT
ELECT REM PT RETURN 9FT ADLT (ELECTROSURGICAL) ×3
ELECTRODE REM PT RTRN 9FT ADLT (ELECTROSURGICAL) ×1 IMPLANT
GLOVE BIO SURGEON STRL SZ7 (GLOVE) ×12 IMPLANT
GLOVE BIOGEL PI IND STRL 7.5 (GLOVE) ×3 IMPLANT
GLOVE BIOGEL PI INDICATOR 7.5 (GLOVE) ×6
GOWN STRL REUS W/ TWL LRG LVL3 (GOWN DISPOSABLE) ×3 IMPLANT
GOWN STRL REUS W/TWL LRG LVL3 (GOWN DISPOSABLE) ×6
KIT TURNOVER KIT A (KITS) ×3 IMPLANT
MESH VENTRALEX ST 2.5 CRC MED (Mesh General) ×3 IMPLANT
NEEDLE HYPO 22GX1.5 SAFETY (NEEDLE) ×3 IMPLANT
NS IRRIG 1000ML POUR BTL (IV SOLUTION) ×3 IMPLANT
PACK BASIN MINOR ARMC (MISCELLANEOUS) ×3 IMPLANT
SUT ETHIBOND 0 MO6 C/R (SUTURE) ×3 IMPLANT
SUT MNCRL AB 4-0 PS2 18 (SUTURE) ×3 IMPLANT
SUT VIC AB 2-0 SH 27 (SUTURE) ×2
SUT VIC AB 2-0 SH 27XBRD (SUTURE) ×1 IMPLANT
SUT VIC AB 3-0 SH 27 (SUTURE) ×2
SUT VIC AB 3-0 SH 27X BRD (SUTURE) ×1 IMPLANT
SYR 10ML LL (SYRINGE) ×3 IMPLANT

## 2019-03-02 NOTE — Transfer of Care (Signed)
Immediate Anesthesia Transfer of Care Note  Patient: Martin Parks  Procedure(s) Performed: OPEN HERNIA REPAIR UMBILICAL ADULT WITH MESH (N/A )  Patient Location: PACU  Anesthesia Type:General  Level of Consciousness: sedated  Airway & Oxygen Therapy: Patient Spontanous Breathing and Patient connected to face mask oxygen  Post-op Assessment: Report given to RN and Post -op Vital signs reviewed and stable  Post vital signs: Reviewed and stable  Last Vitals:  Vitals Value Taken Time  BP 131/95 03/02/2019  9:14 AM  Temp 36.6 C 03/02/2019  9:14 AM  Pulse 74 03/02/2019  9:17 AM  Resp 13 03/02/2019  9:17 AM  SpO2 96 % 03/02/2019  9:17 AM  Vitals shown include unvalidated device data.  Last Pain:  Vitals:   03/02/19 0614  TempSrc: Oral  PainSc: 1          Complications: No apparent anesthesia complications

## 2019-03-02 NOTE — Anesthesia Postprocedure Evaluation (Signed)
Anesthesia Post Note  Patient: Martin Parks  Procedure(s) Performed: OPEN HERNIA REPAIR UMBILICAL ADULT WITH MESH (N/A )  Patient location during evaluation: PACU Anesthesia Type: General Level of consciousness: awake and alert Pain management: pain level controlled Vital Signs Assessment: post-procedure vital signs reviewed and stable Respiratory status: spontaneous breathing, nonlabored ventilation, respiratory function stable and patient connected to nasal cannula oxygen Cardiovascular status: blood pressure returned to baseline and stable Postop Assessment: no apparent nausea or vomiting Anesthetic complications: no     Last Vitals:  Vitals:   03/02/19 1003 03/02/19 1011  BP: 114/83 (!) 145/90  Pulse: 74 75  Resp: 12 18  Temp:  36.4 C  SpO2: 94% 96%    Last Pain:  Vitals:   03/02/19 1011  TempSrc: Temporal  PainSc: [place]

## 2019-03-02 NOTE — Op Note (Signed)
SURGICAL OPERATIVE REPORT  DATE OF PROCEDURE: 03/02/2019  ATTENDING Surgeon(s): Vickie Epley, MD  ASSISTANT(S): Erin Mecum, PA-S  ANESTHESIA: GETA  PRE-OPERATIVE DIAGNOSIS: Increasingly symptomatic (painful) reducible umbilical hernia (ITG-54: K42.9)  POST-OPERATIVE DIAGNOSIS: Increasingly symptomatic (painful) reducible umbilical hernia (DIY-64: K42.9)  PROCEDURE(S): 1.) Open repair of increasingly symptomatic (painful) umbilical hernia with mesh (cpt: 15830)  INTRAOPERATIVE FINDINGS: 2 cm umbilical hernia with adherent omentum and hernia sac  INTRAVENOUS FLUIDS: 1000 mL crystalloid   ESTIMATED BLOOD LOSS: Minimal (<20 mL)   URINE OUTPUT: No Foley catheter  SPECIMENS: None  IMPLANTS: 6.4 cm Bard Ventralex ST ventral hernia repair mesh  DRAINS: None  COMPLICATIONS: None apparent  CONDITION AT END OF PROCEDURE: Hemodynamically stable and extubated  DISPOSITION OF PATIENT: PACU  INDICATIONS FOR PROCEDURE:  Patient is a 44 y.o. otherwise healthy male who presented upon referral from his PMD for evaluation of patient's umbilical hernia. He denied abdominal distention, change in bowel function, or N/V and likewise denied constipation, straining with urination, or frequent coughing and expressed that he'd like to have it repaired. All risks, benefits, and alternatives to above elective procedure were discussed with the patient, all of patient's questions were answered to his expressed satisfaction, and informed consent was obtained and documented accordingly.  DETAILS OF PROCEDURE: Patient was brought to the operating suite and appropriately identified. General anesthesia was administered along with appropriate pre-operative antibiotics, and endotracheal intubation was performed by anesthetist. In supine position, operative site was prepped and draped in the usual sterile fashion, and following a brief time out, local anesthetic was injected inferior to the umbilicus, and a  2 cm transverse curvilinear incision was made using a #15 blade scalpel and extended deep through subcutaneous tissues using blunt dissection and electrocautery. The hernia sac was then dissected from the undersurface of the umbilical skin and stalk, and the fascial defect was cleared of all omental adhesions. A 6.4 cm Bard Ventralex ST mesh patch was selected, inserted, confirmed to approximate well to fascial edges without intervening viscera or omentum, and patch was secured without tension using Ethibond braided non-absorbable suture, which was also used to re-approximate fascia over secured hernia mesh. The umbilicus was then invaginated using 2-0 Vicryl suture from the dermis to fascia, and the wound was irrigated using sterile saline. Overlying tissues were re-approximated in layers using buried interrupted 3-0 Vicryl suture for dermis and re-approximate skin, which was cleaned and dried and sterile Dermabond skin glue was applied and allowed to dry.  Patient was then safely able to be extubated, awakened, and transferred to PACU for post-operative monitoring and care.  I was present for all aspects of the above procedure, and no operative complications were apparent.

## 2019-03-02 NOTE — Anesthesia Preprocedure Evaluation (Signed)
Anesthesia Evaluation  Patient identified by MRN, date of birth, ID band Patient awake    Reviewed: Allergy & Precautions, NPO status , Patient's Chart, lab work & pertinent test results, reviewed documented beta blocker date and time   Airway Mallampati: II  TM Distance: >3 FB     Dental  (+) Chipped   Pulmonary pneumonia, resolved,           Cardiovascular hypertension, Pt. on medications      Neuro/Psych  Headaches, PSYCHIATRIC DISORDERS Anxiety Depression    GI/Hepatic GERD  Controlled,  Endo/Other    Renal/GU      Musculoskeletal   Abdominal   Peds  Hematology   Anesthesia Other Findings   Reproductive/Obstetrics                             Anesthesia Physical Anesthesia Plan  ASA: II  Anesthesia Plan: General   Post-op Pain Management:    Induction: Intravenous  PONV Risk Score and Plan:   Airway Management Planned: Oral ETT  Additional Equipment:   Intra-op Plan:   Post-operative Plan:   Informed Consent: I have reviewed the patients History and Physical, chart, labs and discussed the procedure including the risks, benefits and alternatives for the proposed anesthesia with the patient or authorized representative who has indicated his/her understanding and acceptance.       Plan Discussed with: CRNA  Anesthesia Plan Comments:         Anesthesia Quick Evaluation

## 2019-03-02 NOTE — Anesthesia Post-op Follow-up Note (Signed)
Anesthesia QCDR form completed.        

## 2019-03-02 NOTE — Anesthesia Procedure Notes (Signed)
Procedure Name: Intubation Date/Time: 03/02/2019 7:44 AM Performed by: Justus Memory, CRNA Pre-anesthesia Checklist: Patient identified, Patient being monitored, Timeout performed, Emergency Drugs available and Suction available Patient Re-evaluated:Patient Re-evaluated prior to induction Oxygen Delivery Method: Circle system utilized Preoxygenation: Pre-oxygenation with 100% oxygen Induction Type: IV induction Ventilation: Mask ventilation without difficulty Laryngoscope Size: Mac and 3 Grade View: Grade I Tube type: Oral Tube size: 7.0 mm Number of attempts: 1 Airway Equipment and Method: Stylet Placement Confirmation: ETT inserted through vocal cords under direct vision,  positive ETCO2 and breath sounds checked- equal and bilateral Secured at: 21 cm Tube secured with: Tape Dental Injury: Teeth and Oropharynx as per pre-operative assessment

## 2019-03-02 NOTE — Discharge Instructions (Addendum)
In addition to included general post-operative instructions for Open Repair of Umbilical Hernia with mesh,  Diet: Resume home heart healthy diet.   Activity: No heavy lifting >15 - 20 pounds (children, pets, laundry, garbage) or strenuous activity until follow-up in 2 weeks, but light activity and walking are encouraged. Do not drive or drink alcohol if taking narcotic pain medications.  Wound care: 2 days after surgery (Wednesday, 3/4), you may shower/get incision wet with soapy water and pat dry (do not rub incisions), but no baths or submerging incision underwater until follow-up.   Medications: Resume all home medications. For mild to moderate pain: acetaminophen (Tylenol) or ibuprofen/naproxen (if no kidney disease). Combining Tylenol with alcohol can substantially increase your risk of causing liver disease. Narcotic pain medications, if prescribed, can be used for severe pain, though may cause nausea, constipation, and drowsiness. Do not combine Tylenol and Percocet (or similar) within a 6 hour period as Percocet (and similar) contain(s) Tylenol. If you do not need the narcotic pain medication, you do not need to fill the prescription.  Call office [phone]) at any time if any questions, worsening pain, fevers/chills, bleeding, drainage from incision site, or other concerns.  AMBULATORY SURGERY  DISCHARGE INSTRUCTIONS   1) The drugs that you were given will stay in your system until tomorrow so for the next 24 hours you should not:  A) Drive an automobile B) Make any legal decisions C) Drink any alcoholic beverage   2) You may resume regular meals tomorrow.  Today it is better to start with liquids and gradually work up to solid foods.  You may eat anything you prefer, but it is better to start with liquids, then soup and crackers, and gradually work up to solid foods.   3) Please notify your doctor immediately if you have any unusual bleeding, trouble breathing, redness and  pain at the surgery site, drainage, fever, or pain not relieved by medication.    4) Additional Instructions:        Please contact your physician with any problems or Same Day Surgery at [phone], Monday through Friday 6 am to 4 pm, or [place] at [facility] number at [phone].

## 2019-03-02 NOTE — Interval H&P Note (Signed)
History and Physical Interval Note:  03/02/2019 7:26 AM  Martin Parks  has presented today for surgery, with the diagnosis of UMBILICAL HERNIA  The various methods of treatment have been discussed with the patient and family. After consideration of risks, benefits and other options for treatment, the patient has consented to  Procedure(s): OPEN HERNIA REPAIR UMBILICAL ADULT WITH MESH (N/A) as a surgical intervention .  The patient's history has been reviewed, patient examined, no change in status, stable for surgery.  I have reviewed the patient's chart and labs.  Questions were answered to the patient's satisfaction.     Vickie Epley

## 2019-03-05 ENCOUNTER — Telehealth: Payer: Self-pay | Admitting: *Deleted

## 2019-03-05 NOTE — Telephone Encounter (Signed)
Spoke with the patient and let him know this is common to be a little constipated after surgery. He may use Milk of Magnesia, prune juice, or if this does not work he may use Magnesium Citrate as directed. He is aware to increase his fluids and fiber in his diet. He will call with any further questions.

## 2019-03-05 NOTE — Telephone Encounter (Signed)
Patient called and stated that he has not had a bowel movement since surgery, he had umbilical hernia surgery on 03/02/19, he is taking miralax and eating a soft diet. Please call and advise.

## 2019-03-17 ENCOUNTER — Ambulatory Visit (INDEPENDENT_AMBULATORY_CARE_PROVIDER_SITE_OTHER): Payer: Commercial Managed Care - PPO | Admitting: Surgery

## 2019-03-17 ENCOUNTER — Other Ambulatory Visit: Payer: Self-pay

## 2019-03-17 ENCOUNTER — Encounter: Payer: Self-pay | Admitting: Surgery

## 2019-03-17 VITALS — BP 157/84 | HR 88 | Temp 97.7°F | Ht 70.5 in | Wt 232.2 lb

## 2019-03-17 DIAGNOSIS — K429 Umbilical hernia without obstruction or gangrene: Secondary | ICD-10-CM

## 2019-03-17 DIAGNOSIS — Z4889 Encounter for other specified surgical aftercare: Secondary | ICD-10-CM

## 2019-03-17 NOTE — Progress Notes (Signed)
Surgical Clinic Progress/Follow-up Note   HPI:  44 y.o. Male presents to clinic for post-op follow-up 15 Days s/p open repair of his formerly increasingly symptomatic (painful) umbilical hernia repair with mesh Rosana Hoes, 03/02/2019). Patient reports complete resolution of his pre-operative pain and peri-op constipation and has been tolerating regular diet with +flatus and normal BM's, denies N/V, fever/chills, CP, or SOB. He describes he can occasionally feel pulling/tugging above and below his umbilicus with certain positions and movements, but denies any further pain.  Review of Systems:  Constitutional: denies fever/chills  Respiratory: denies shortness of breath, wheezing  Cardiovascular: denies chest pain, palpitations  Gastrointestinal: abdominal pain, N/V, and bowel function as per interval history Skin: Denies any other rashes or skin discolorations except post-surgical wounds as per interval history  Vital Signs:  BP (!) 157/84   Pulse 88   Temp 97.7 F (36.5 C) (Temporal)   Ht 5' 10.5" (1.791 m)   Wt 232 lb 3.2 oz (105.3 kg)   SpO2 97%   BMI 32.85 kg/m    Physical Exam:  Constitutional:  -- Normal body habitus  -- Awake, alert, and oriented x3  Pulmonary:  -- No crackles -- Equal breath sounds bilaterally -- Breathing non-labored at rest Cardiovascular:  -- S1, S2 present  -- No pericardial rubs  Gastrointestinal:  -- Soft and non-distended, non-tender to palpation, no guarding/rebound tenderness -- Post-surgical incisions all well-approximated without any peri-incisional erythema or drainage, mild supra-umbilical firmness, likewise without ecchymosis, tenderness to palpation, or erythema -- No abdominal masses appreciated, pulsatile or otherwise  Musculoskeletal / Integumentary:  -- Wounds or skin discoloration: None appreciated except post-surgical incisions as described above (GI) -- Extremities: B/L UE and LE FROM, hands and feet warm, no edema   Imaging: No  new pertinent imaging available for review  Assessment:  44 y.o. yo Male with a problem list including...  Patient Active Problem List   Diagnosis Date Noted  . Preventative health care 02/08/2019  . Umbilical hernia without obstruction and without gangrene 12/22/2018  . Hyperactivity 12/22/2018  . Generalized anxiety disorder 12/22/2018  . Obesity (BMI 30.0-34.9) 12/22/2018  . Hyperlipidemia LDL goal <100 12/22/2018    presents to clinic for post-op follow-up evaluation, doing well 15 Days s/p open repair of his formerly increasingly symptomatic (painful) umbilical hernia repair with mesh Rosana Hoes, 03/02/2019).  Plan:              - advance diet as tolerated              - okay to submerge incisions under water (baths, swimming) prn             - avoid activities that cause pain and no heavy lifting >40 lbs x 4 more weeks, after which may gradually resume all activities without restrictions             - apply sunblock particularly to incisions with sun exposure to reduce pigmentation of scars             - return to clinic as needed, instructed to call office if any questions or concerns  All of the above recommendations were discussed with the patient, and all of patient's questions were answered to his expressed satisfaction.  -- Marilynne Drivers Rosana Hoes, MD, [facility]: [facility] - Partnering for exceptional care. Office: [phone]

## 2019-03-17 NOTE — Patient Instructions (Addendum)
Patient will need to return to the office as needed.    Call the office with any questions or concerns.

## 2019-09-02 IMAGING — CT CT ABD-PELV W/ CM
1 of 3 series · 14 of 32 positions shown, 19 images · IV contrast (APPLIED)
Comparison: 01/28/2013 CT.

CLINICAL DATA: 44-year-old male with left lower quadrant pain for
several years. History of inguinal hernia repair 5887. Schedule for
umbilical hernia repair March 2019. Initial encounter.

EXAM:
CT ABDOMEN AND PELVIS WITH CONTRAST
TECHNIQUE: Multidetector CT imaging of the abdomen and pelvis was performed
using the standard protocol following bolus administration of
intravenous contrast.
CONTRAST:  100mL [id number] IOPAMIDOL ([id number]) INJECTION 61%

[Series 2: axial st · axial · 0.79mm/px · z∈[-504,-54]mm · 14 of 100 slices shown, 19 images]
[im 5/100  soft-tissue]
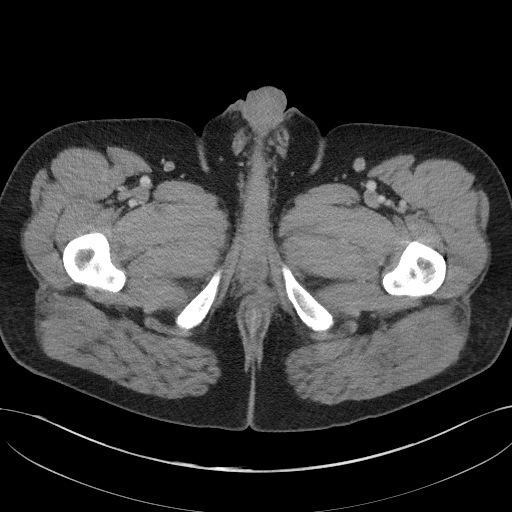
[im 5/100  bone]
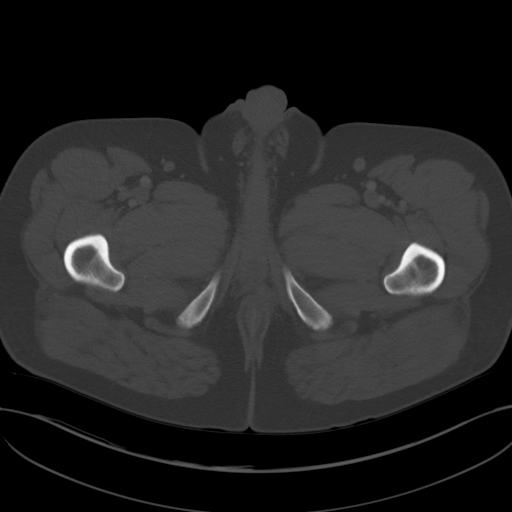
[im 15/100  soft-tissue]
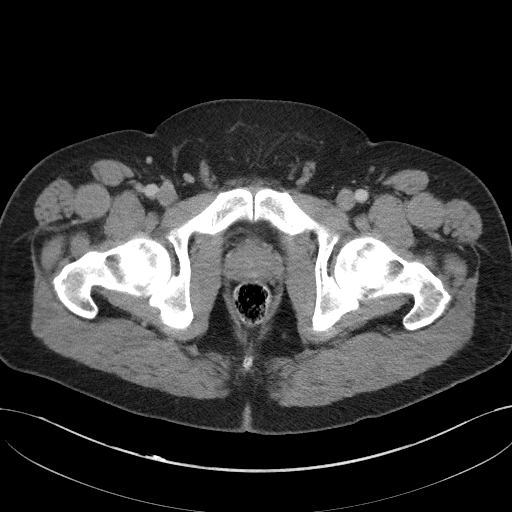
[im 20/100  soft-tissue]
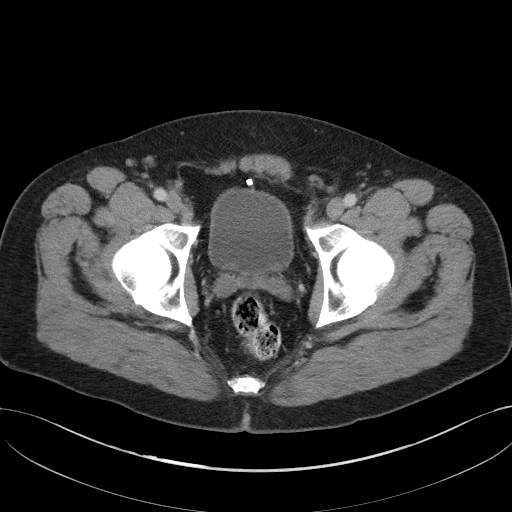
[im 30/100  soft-tissue]
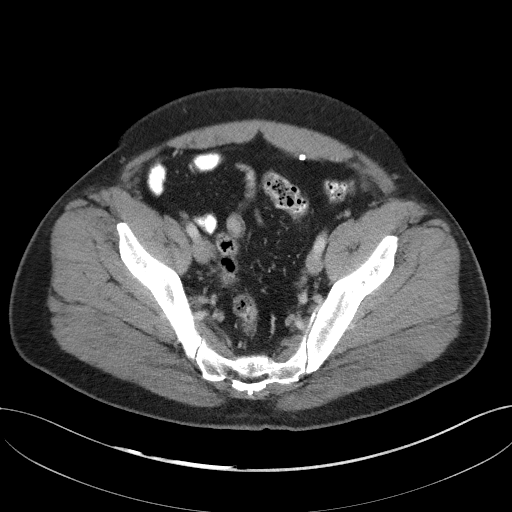
[im 35/100  soft-tissue]
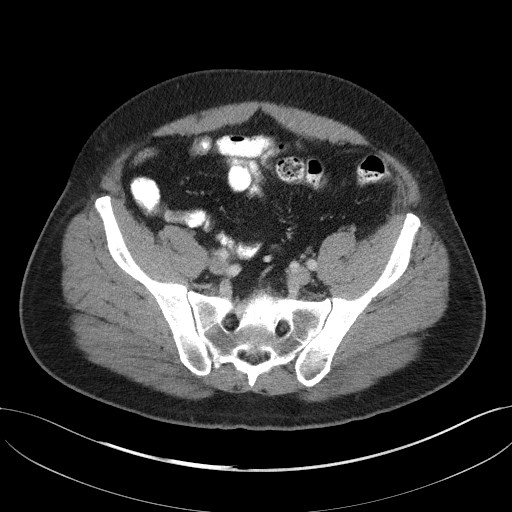
[im 45/100  soft-tissue]
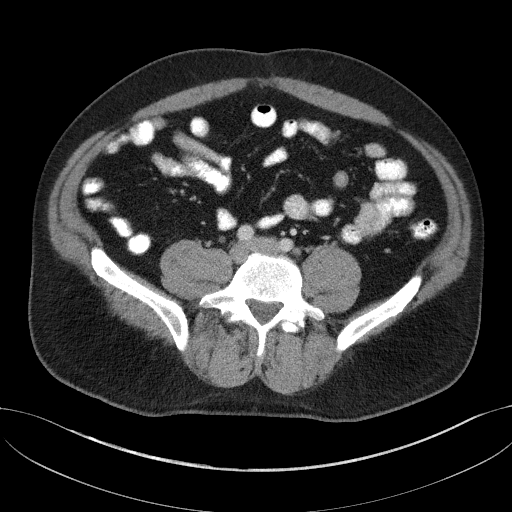
[im 50/100  soft-tissue]
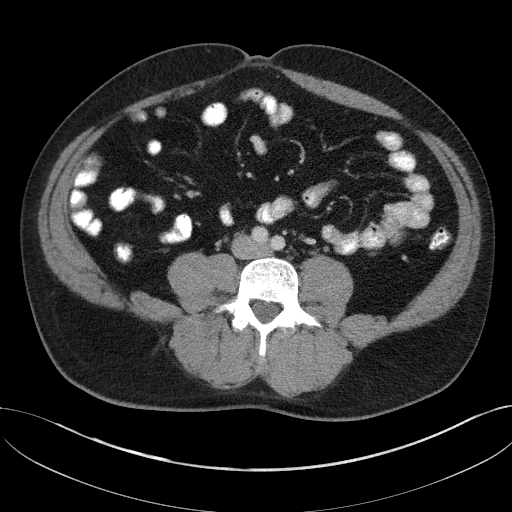
[im 55/100  soft-tissue]
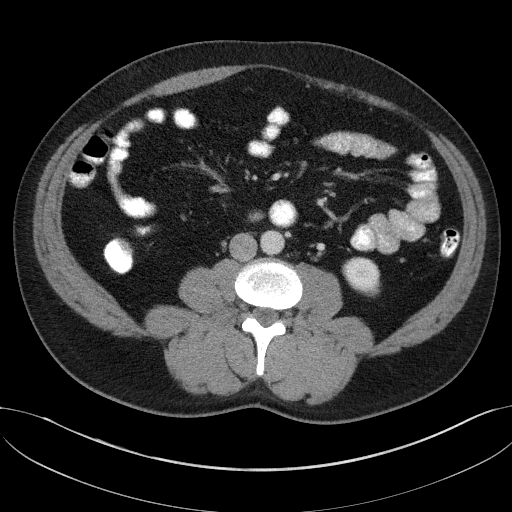
[im 65/100  soft-tissue]
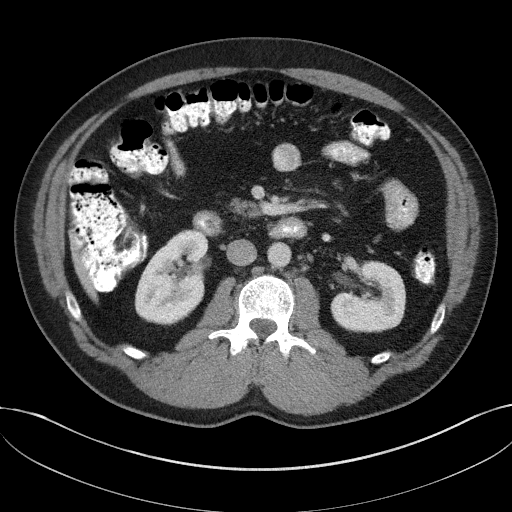
[im 65/100  bone]
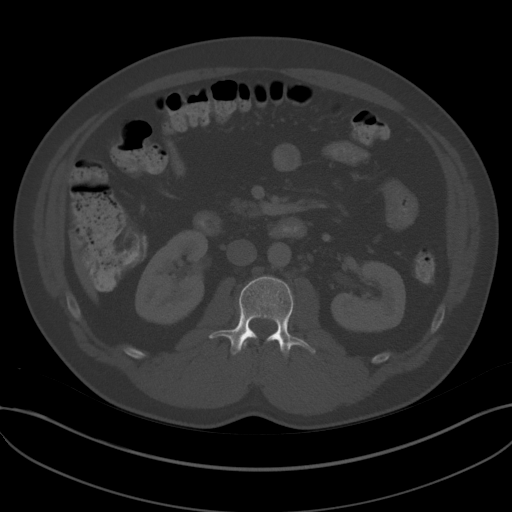
[im 70/100  soft-tissue]
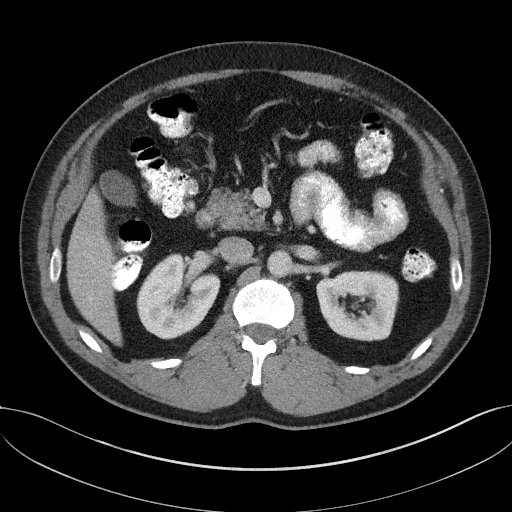
[im 80/100  soft-tissue]
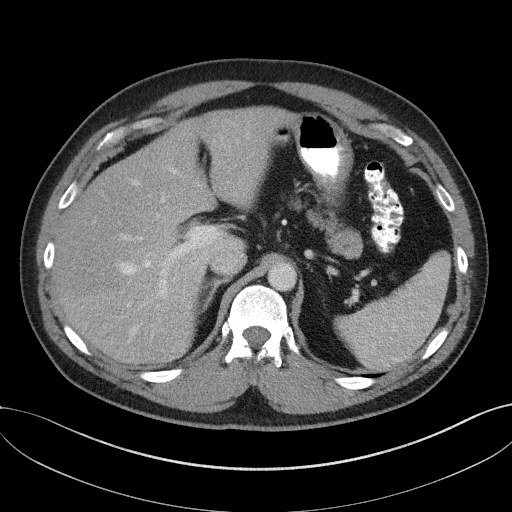
[im 80/100  lung]
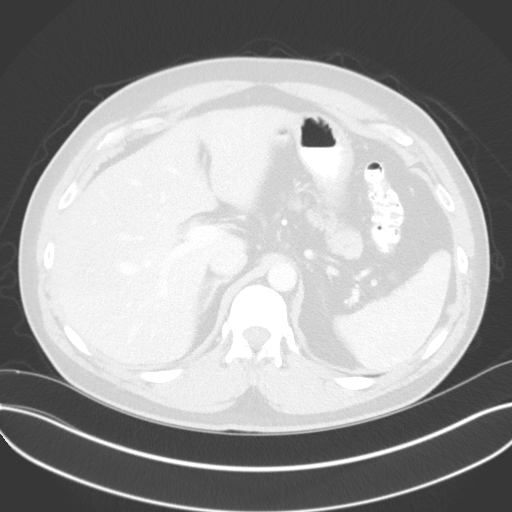
[im 85/100  soft-tissue]
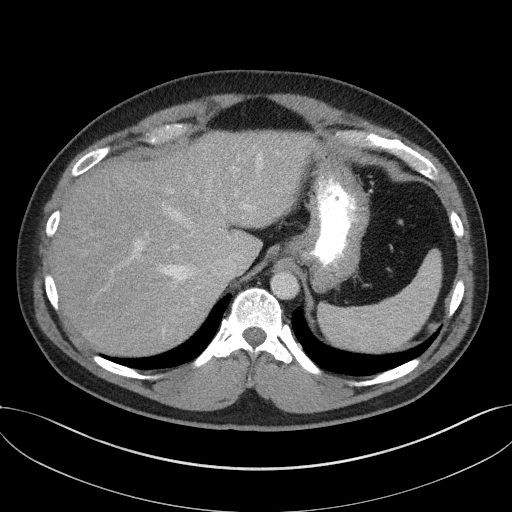
[im 85/100  lung]
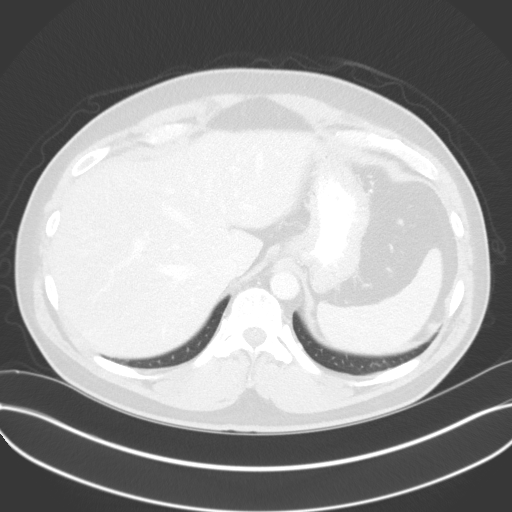
[im 90/100  lung]
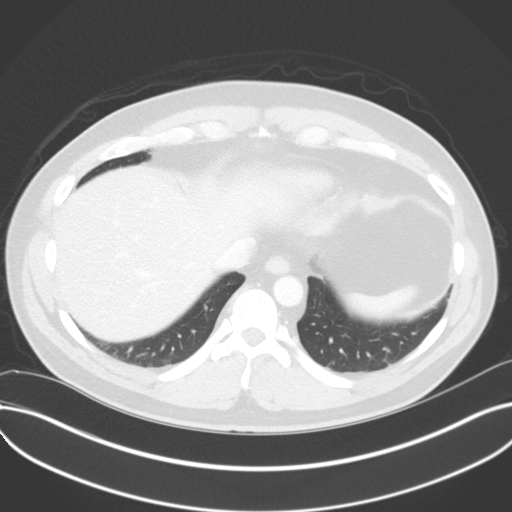
[im 95/100  soft-tissue]
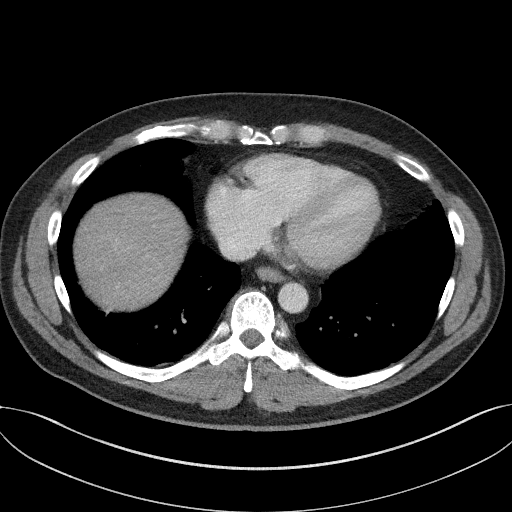
[im 95/100  lung]
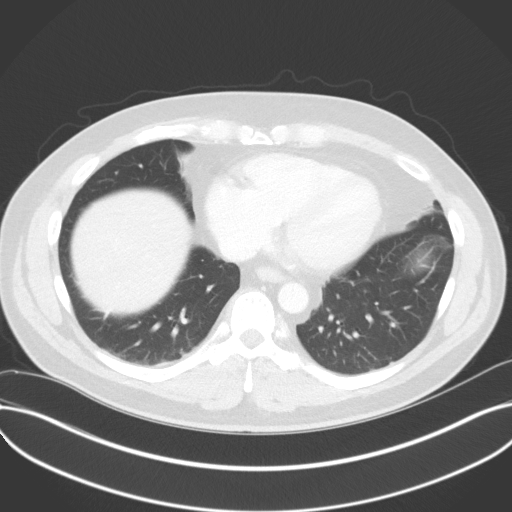

[14 of 32 positions shown; findings below may reference images not displayed]

FINDINGS: Lower chest: Basilar scarring/atelectasis. Heart size within normal
limits.

Hepatobiliary: Fatty liver. No worrisome hepatic lesion. No
calcified gallstone or common bile duct stone. No CT evidence of
gallbladder inflammation.

Pancreas: No worrisome pancreatic lesion or inflammation.

Spleen: No splenic mass or enlargement.

Adrenals/Urinary Tract: No obstructing stone or hydronephrosis. Left
lower pole nonobstructing 2 mm renal calculus. No worrisome renal or
adrenal lesion. Noncontrast filled views the urinary bladder
unremarkable.

Stomach/Bowel: No extraluminal bowel inflammatory process. Cecum
superiorly displaced.

Portions of stomach, small bowel and colon under distended slightly
limiting evaluation. No obvious mass.

Vascular/Lymphatic: No abdominal aortic aneurysm or large vessel
occlusion.

Scattered normal size lymph nodes most notable upper abdomen.

Reproductive: No worrisome abnormality.

Other: Prior left inguinal hernia repair with mesh in place. No
bowel containing hernia. At the base of the umbilicus, small fat
containing hernia measuring up to 1.1 cm. No free intraperitoneal
air.

Musculoskeletal: Mild curvature lumbar spine convex left.
IMPRESSION: 1. No extraluminal bowel inflammatory process.
2. Prior left inguinal hernia repair with mesh in place. No bowel
containing hernia noted.
3. At the base of the umbilicus, small fat containing hernia
measuring up to 1.1 cm.
4. Fatty liver.
5. Left lower pole nonobstructing 2 mm renal calculus.

## 2020-02-09 ENCOUNTER — Other Ambulatory Visit: Payer: Self-pay

## 2020-02-09 ENCOUNTER — Encounter: Payer: Self-pay | Admitting: Family Medicine

## 2020-02-09 ENCOUNTER — Ambulatory Visit (INDEPENDENT_AMBULATORY_CARE_PROVIDER_SITE_OTHER): Payer: Commercial Managed Care - PPO | Admitting: Family Medicine

## 2020-02-09 VITALS — BP 132/84 | HR 95 | Temp 98.3°F | Resp 14 | Ht 71.0 in | Wt 241.3 lb

## 2020-02-09 DIAGNOSIS — Z1329 Encounter for screening for other suspected endocrine disorder: Secondary | ICD-10-CM | POA: Diagnosis not present

## 2020-02-09 DIAGNOSIS — R030 Elevated blood-pressure reading, without diagnosis of hypertension: Secondary | ICD-10-CM

## 2020-02-09 DIAGNOSIS — Z13228 Encounter for screening for other metabolic disorders: Secondary | ICD-10-CM

## 2020-02-09 DIAGNOSIS — E669 Obesity, unspecified: Secondary | ICD-10-CM

## 2020-02-09 DIAGNOSIS — Z6833 Body mass index (BMI) 33.0-33.9, adult: Secondary | ICD-10-CM

## 2020-02-09 DIAGNOSIS — Z0000 Encounter for general adult medical examination without abnormal findings: Secondary | ICD-10-CM

## 2020-02-09 DIAGNOSIS — R0683 Snoring: Secondary | ICD-10-CM

## 2020-02-09 DIAGNOSIS — Z13220 Encounter for screening for lipoid disorders: Secondary | ICD-10-CM | POA: Diagnosis not present

## 2020-02-09 DIAGNOSIS — R29818 Other symptoms and signs involving the nervous system: Secondary | ICD-10-CM

## 2020-02-09 DIAGNOSIS — L989 Disorder of the skin and subcutaneous tissue, unspecified: Secondary | ICD-10-CM

## 2020-02-09 DIAGNOSIS — Z130 Encounter for screening for diseases of the blood and blood-forming organs and certain disorders involving the immune mechanism: Secondary | ICD-10-CM

## 2020-02-09 NOTE — Patient Instructions (Signed)
Preventive Care 41-44 Years Old, Male Preventive care refers to lifestyle choices and visits with your health care provider that can promote health and wellness. This includes:  A yearly physical exam. This is also called an annual well check.  Regular dental and eye exams.  Immunizations.  Screening for certain conditions.  Healthy lifestyle choices, such as eating a healthy diet, getting regular exercise, not using drugs or products that contain nicotine and tobacco, and limiting alcohol use. What can I expect for my preventive care visit? Physical exam Your health care provider will check:  Height and weight. These may be used to calculate body mass index (BMI), which is a measurement that tells if you are at a healthy weight.  Heart rate and blood pressure.  Your skin for abnormal spots. Counseling Your health care provider may ask you questions about:  Alcohol, tobacco, and drug use.  Emotional well-being.  Home and relationship well-being.  Sexual activity.  Eating habits.  Work and work Statistician. What immunizations do I need?  Influenza (flu) vaccine  This is recommended every year. Tetanus, diphtheria, and pertussis (Tdap) vaccine  You may need a Td booster every 10 years. Varicella (chickenpox) vaccine  You may need this vaccine if you have not already been vaccinated. Zoster (shingles) vaccine  You may need this after age 64. Measles, mumps, and rubella (MMR) vaccine  You may need at least one dose of MMR if you were born in 1957 or later. You may also need a second dose. Pneumococcal conjugate (PCV13) vaccine  You may need this if you have certain conditions and were not previously vaccinated. Pneumococcal polysaccharide (PPSV23) vaccine  You may need one or two doses if you smoke cigarettes or if you have certain conditions. Meningococcal conjugate (MenACWY) vaccine  You may need this if you have certain conditions. Hepatitis A  vaccine  You may need this if you have certain conditions or if you travel or work in places where you may be exposed to hepatitis A. Hepatitis B vaccine  You may need this if you have certain conditions or if you travel or work in places where you may be exposed to hepatitis B. Haemophilus influenzae type b (Hib) vaccine  You may need this if you have certain risk factors. Human papillomavirus (HPV) vaccine  If recommended by your health care provider, you may need three doses over 6 months. You may receive vaccines as individual doses or as more than one vaccine together in one shot (combination vaccines). Talk with your health care provider about the risks and benefits of combination vaccines. What tests do I need? Blood tests  Lipid and cholesterol levels. These may be checked every 5 years, or more frequently if you are over 60 years old.  Hepatitis C test.  Hepatitis B test. Screening  Lung cancer screening. You may have this screening every year starting at age 43 if you have a 30-pack-year history of smoking and currently smoke or have quit within the past 15 years.  Prostate cancer screening. Recommendations will vary depending on your family history and other risks.  Colorectal cancer screening. All adults should have this screening starting at age 72 and continuing until age 2. Your health care provider may recommend screening at age 14 if you are at increased risk. You will have tests every 1-10 years, depending on your results and the type of screening test.  Diabetes screening. This is done by checking your blood sugar (glucose) after you have not eaten  for a while (fasting). You may have this done every 1-3 years.  Sexually transmitted disease (STD) testing. Follow these instructions at home: Eating and drinking  Eat a diet that includes fresh fruits and vegetables, whole grains, lean protein, and low-fat dairy products.  Take vitamin and mineral supplements as  recommended by your health care provider.  Do not drink alcohol if your health care provider tells you not to drink.  If you drink alcohol: ? Limit how much you have to 0-2 drinks a day. ? Be aware of how much alcohol is in your drink. In the [place] one drink equals one 12 oz bottle of beer (355 mL), one 5 oz glass of wine (148 mL), or one 1 oz glass of hard liquor (44 mL). Lifestyle  Take daily care of your teeth and gums.  Stay active. Exercise for at least 30 minutes on 5 or more days each week.  Do not use any products that contain nicotine or tobacco, such as cigarettes, e-cigarettes, and chewing tobacco. If you need help quitting, ask your health care provider.  If you are sexually active, practice safe sex. Use a condom or other form of protection to prevent STIs (sexually transmitted infections).  Talk with your health care provider about taking a low-dose aspirin every day starting at age 53. What's next?  Go to your health care provider once a year for a well check visit.  Ask your health care provider how often you should have your eyes and teeth checked.  Stay up to date on all vaccines. This information is not intended to replace advice given to you by your health care provider. Make sure you discuss any questions you have with your health care provider. Document Revised: 12/11/2018 Document Reviewed: 12/11/2018 Elsevier Patient Education  2020 [employer].

## 2020-02-09 NOTE — Progress Notes (Signed)
Patient: Martin Parks, Male    DOB: 16-Oct-1975, 45 y.o.   MRN: [id number] Arnetha Courser, MD Visit Date: 02/09/2020  Today's Provider: Delsa Grana, PA-C   Chief Complaint  Patient presents with   Annual Exam   Snoring    witnessed stopped breathing, wants sleep study   Nevus    dermatology referral   Subjective:   Annual physical exam:  Martin Parks is a 45 y.o. male who presents today for health maintenance and annual & complete physical exam.  He feels fairly well Balance app, meditation, sees a psychiatrist who manages meds He reports exercising not as much as normal - last year has been hard with changes in lifestyle - usually gets 10,000 steps in a day and works in yard, but he is having so many changes at home where his exercise has decreased Diet - eating at home, generally healthy diet and cooking at home He reports he is sleeping fairly well. Kids and working at home every one is having more stress at home He has some concerns of sleep apnea - interested in sleep study  Cholesterol has been high   The 10-year ASCVD risk score Mikey Bussing DC Brooke Bonito., et al., 2013) is: 2.9%   Values used to calculate the score:     Age: 69 years     Sex: Male     Is Non-Hispanic African American: No     Diabetic: No     Tobacco smoker: No     Systolic Blood Pressure: 626 mmHg     Is BP treated: No     HDL Cholesterol: 42 mg/dL     Total Cholesterol: 222 mg/dL    USPSTF grade A and B recommendations - reviewed and addressed today  Depression:  Phq 9 completed today by patient, was reviewed by me with patient in the room, score is  negative, pt feels good overall PHQ 2/9 Scores 02/09/2020 02/06/2019 12/22/2018 03/07/2016  PHQ - 2 Score 0 0 1 0  PHQ- 9 Score 0 0 6 2   Depression screen [facility] 2/9 02/09/2020 02/06/2019 12/22/2018 03/07/2016  Decreased Interest 0 0 0 0  Down, Depressed, Hopeless 0 0 1 0  PHQ - 2 Score 0 0 1 0  Altered sleeping 0 0 2 1  Tired, decreased energy 0 0 2 1   Change in appetite 0 0 1 0  Feeling bad or failure about yourself  0 0 0 0  Trouble concentrating 0 0 0 0  Moving slowly or fidgety/restless 0 0 0 0  Suicidal thoughts 0 0 0 0  PHQ-9 Score 0 0 6 2  Difficult doing work/chores Not difficult at all Not difficult at all Somewhat difficult -    Hep C Screening:  Not indicated STD testing and prevention (HIV/chl/gon/syphilis): n/a low risk  Prostate cancer:   Family hx for multiple men of BPH Prostate cancer screening with PSA: Discussed risks and benefits of PSA testing and provided handout. Pt   to have PSA drawn today.  Lab Results  Component Value Date   PSA 0.4 09/25/2018   Intimate partner violence:  none  Urinary Symptoms:  IPSS Questionnaire (AUA-7): Over the past month   1)  How often have you had a sensation of not emptying your bladder completely after you finish urinating?  0 - Not at all  2)  How often have you had to urinate again less than two hours after you finished urinating? 0 - Not at all  3)  How often have you found you stopped and started again several times when you urinated?  0 - Not at all  4) How difficult have you found it to postpone urination?  0 - Not at all  5) How often have you had a weak urinary stream?  0 - Not at all  6) How often have you had to push or strain to begin urination?  0 - Not at all  7) How many times did you most typically get up to urinate from the time you went to bed until the time you got up in the morning?  1 - 1 time  Total score:  0-7 mildly symptomatic   8-19 moderately symptomatic   20-35 severely symptomatic    Advanced Care Planning:  A voluntary discussion about advance care planning including the explanation and discussion of advance directives.  Discussed health care proxy and Living will, and the patient was able to identify a health care proxy as his wife Willette Brace .  Patient does not have a living will at present time. If patient does have living will, I have  requested they bring this to the clinic to be scanned in to their chart.  Skin cancer: pt sees dermatology, few years ago was his last skin survey.  Pt reports no hx of skin cancer, suspicious lesions/biopsies in the past. - lesion to right cheek onset a few months ago  Colorectal cancer:  colonoscopy is not yet due  Lung cancer:   Low Dose CT Chest recommended if Age 52-80 years, 30 pack-year currently smoking OR have quit w/in 15years. Patient does not qualify.   Social History   Tobacco Use   Smoking status: Never Smoker   Smokeless tobacco: Never Used  Substance Use Topics   Alcohol use: Yes    Alcohol/week: 0.0 standard drinks    Comment: rare     Alcohol screening:   [facility] Visit from 02/09/2020 in [facility]  AUDIT-C Score  1      AAA: not indicated The USPSTF recommends one-time screening with ultrasonography in men ages 32 to 62 years who have ever smoked  ECG: not indicated today  Blood pressure/Hypertension: BP Readings from Last 3 Encounters:  02/09/20 132/84  03/17/19 (!) 157/84  03/02/19 (!) 145/90   Weight/Obesity: Wt Readings from Last 3 Encounters:  02/09/20 241 lb 4.8 oz (109.5 kg)  03/17/19 232 lb 3.2 oz (105.3 kg)  03/02/19 225 lb (102.1 kg)   BMI Readings from Last 3 Encounters:  02/09/20 33.65 kg/m  03/17/19 32.85 kg/m  03/02/19 31.83 kg/m    Lipids:  Lab Results  Component Value Date   CHOL 222 (H) 12/22/2018   CHOL 212 (A) 09/25/2018   CHOL 226 (H) 03/07/2016   Lab Results  Component Value Date   HDL 42 12/22/2018   HDL 45 09/25/2018   HDL 36 (L) 03/07/2016   Lab Results  Component Value Date   LDLCALC 144 (H) 12/22/2018   LDLCALC 144 09/25/2018   LDLCALC 119 (H) 03/07/2016   Lab Results  Component Value Date   TRIG 222 (H) 12/22/2018   TRIG 116 09/25/2018   TRIG 353 (H) 03/07/2016   Lab Results  Component Value Date   CHOLHDL 5.3 (H) 12/22/2018   No results found for: LDLDIRECT Based on the  results of lipid panel his/her cardiovascular risk factor ( using [employer] )  in the next 10 years is : The 10-year ASCVD risk  score Mikey Bussing DC Jr., et al., 2013) is: 2.9%   Values used to calculate the score:     Age: 45 years     Sex: Male     Is Non-Hispanic African American: No     Diabetic: No     Tobacco smoker: No     Systolic Blood Pressure: 786 mmHg     Is BP treated: No     HDL Cholesterol: 42 mg/dL     Total Cholesterol: 222 mg/dL Glucose:  Glucose  Date Value Ref Range Status  03/07/2016 80 65 - 99 mg/dL Final  01/27/2013 128 (H) 65 - 99 mg/dL Final   Glucose, Bld  Date Value Ref Range Status  12/22/2018 81 65 - 139 mg/dL Final    Comment:    .        Non-fasting reference interval .     Social History      He  reports that he has never smoked. He has never used smokeless tobacco. He reports current alcohol use. He reports that he does not use drugs.       Social History   Socioeconomic History   Marital status: Married    Spouse name: Willette Brace   Number of children: 2   Years of education: 12   Highest education level: Bachelor's degree (e.g., BA, AB, BS)  Occupational History   Not on file  Tobacco Use   Smoking status: Never Smoker   Smokeless tobacco: Never Used  Substance and Sexual Activity   Alcohol use: Yes    Alcohol/week: 0.0 standard drinks    Comment: rare   Drug use: No   Sexual activity: Yes    Partners: Female  Other Topics Concern   Not on file  Social History Narrative   Not on file   Social Determinants of Health   Financial Resource Strain:    Difficulty of Paying Living Expenses: Not on file  Food Insecurity:    Worried About Charity fundraiser in the Last Year: Not on file   [employer] of Food in the Last Year: Not on file  Transportation Needs:    Lack of Transportation (Medical): Not on file   Lack of Transportation (Non-Medical): Not on file  Physical Activity:    Days of Exercise per Week: Not  on file   Minutes of Exercise per Session: Not on file  Stress:    Feeling of Stress : Not on file  Social Connections:    Frequency of Communication with Friends and Family: Not on file   Frequency of Social Gatherings with Friends and Family: Not on file   Attends Religious Services: Not on file   Active Member of Clubs or Organizations: Not on file   Attends Archivist Meetings: Not on file   Marital Status: Not on file         Social History   Socioeconomic History   Marital status: Married    Spouse name: Willette Brace   Number of children: 2   Years of education: 12   Highest education level: Bachelor's degree (e.g., BA, AB, BS)  Occupational History   Not on file  Tobacco Use   Smoking status: Never Smoker   Smokeless tobacco: Never Used  Substance and Sexual Activity   Alcohol use: Yes    Alcohol/week: 0.0 standard drinks    Comment: rare   Drug use: No   Sexual activity: Yes    Partners: Female  Other  Topics Concern   Not on file  Social History Narrative   Not on file   Social Determinants of Health   Financial Resource Strain:    Difficulty of Paying Living Expenses: Not on file  Food Insecurity:    Worried About [place] in the Last Year: Not on file   Ran Out of Food in the Last Year: Not on file  Transportation Needs:    Lack of Transportation (Medical): Not on file   Lack of Transportation (Non-Medical): Not on file  Physical Activity:    Days of Exercise per Week: Not on file   Minutes of Exercise per Session: Not on file  Stress:    Feeling of Stress : Not on file  Social Connections:    Frequency of Communication with Friends and Family: Not on file   Frequency of Social Gatherings with Friends and Family: Not on file   Attends Religious Services: Not on file   Active Member of Clubs or Organizations: Not on file   Attends Archivist Meetings: Not on file   Marital Status: Not  on file  Intimate Partner Violence:    Fear of Current or Ex-Partner: Not on file   Emotionally Abused: Not on file   Physically Abused: Not on file   Sexually Abused: Not on file    Family History        Family Status  Relation Name Status   Mother  Alive   Father  Alive   Sister  Alive   Sister  Alive   Sister  Alive   Daughter  Alive   Son  Alive        His family history includes Cancer in his sister.       Family History  Problem Relation Age of Onset   Cancer Sister        breast    Patient Active Problem List   Diagnosis Date Noted   Preventative health care 02/08/2019   Hyperactivity 12/22/2018   Generalized anxiety disorder 12/22/2018   Obesity (BMI 30.0-34.9) 12/22/2018   Hyperlipidemia LDL goal <100 12/22/2018    Past Surgical History:  Procedure Laterality Date   HERNIA REPAIR     inguinal   TONSILLECTOMY     UMBILICAL HERNIA REPAIR N/A 03/02/2019   Procedure: OPEN HERNIA REPAIR UMBILICAL ADULT WITH MESH;  Surgeon: Vickie Epley, MD;  Location: [facility] ORS;  Service: General;  Laterality: N/A;     Current Outpatient Medications:    ALPRAZolam (XANAX) 0.5 MG tablet, Take 0.5 mg by mouth 2 (two) times daily as needed for anxiety. , Disp: , Rfl: 5   b complex vitamins capsule, Take 1 capsule by mouth daily., Disp: , Rfl:    cholecalciferol (VITAMIN D3) 25 MCG (1000 UNIT) tablet, Take 5,000 Units by mouth daily., Disp: , Rfl:    desvenlafaxine (PRISTIQ) 100 MG 24 hr tablet, Take 100 mg by mouth every morning. , Disp: , Rfl:    fluticasone (FLONASE) 50 MCG/ACT nasal spray, SHAKE WELL AND USE 2 SPRAYS IN EACH NOSTRIL DAILY (Patient taking differently: Place 2 sprays into both nostrils daily. ), Disp: 16 g, Rfl: 12   Misc Natural Products (OSTEO BI-FLEX ADV TRIPLE ST) TABS, Take 1 tablet by mouth daily., Disp: , Rfl:    Omega-3 Fatty Acids (FISH OIL) 1000 MG CAPS, Take 1,000 mg by mouth daily. , Disp: , Rfl:    Turmeric 500 MG  CAPS, Take 500 mg by mouth  daily. , Disp: , Rfl:   Allergies  Allergen Reactions   Adhesive [Tape] Rash    Patient Care Team: Arnetha Courser, MD as PCP - General (Family Medicine)  I personally reviewed active problem list, medication list, allergies, family history, social history, health maintenance, notes from last encounter, lab results, imaging with the patient/caregiver today.  Review of Systems  Constitutional: Negative.  Negative for activity change, appetite change, fatigue and unexpected weight change.  HENT: Negative.   Eyes: Negative.   Respiratory: Negative.  Negative for shortness of breath.   Cardiovascular: Negative.  Negative for chest pain, palpitations and leg swelling.  Gastrointestinal: Negative.  Negative for abdominal pain and blood in stool.  Endocrine: Negative.   Genitourinary: Negative.  Negative for decreased urine volume, difficulty urinating, testicular pain and urgency.  Musculoskeletal: Negative.   Skin: Negative.  Negative for color change and pallor.  Allergic/Immunologic: Negative.   Neurological: Negative.  Negative for syncope, weakness, light-headedness and numbness.  Hematological: Negative.   Psychiatric/Behavioral: Negative.  Negative for confusion, dysphoric mood, self-injury and suicidal ideas. The patient is not nervous/anxious.   All other systems reviewed and are negative.         Objective:   Vitals:  Vitals:   02/09/20 1108 02/09/20 1127  BP: (!) 144/92 132/84  Pulse: 95   Resp: 14   Temp: 98.3 F (36.8 C)   SpO2: 96%   Weight: 241 lb 4.8 oz (109.5 kg)   Height: 5' 11"  (1.803 m)     Body mass index is 33.65 kg/m.  Physical Exam Vitals and nursing note reviewed.  Constitutional:      General: He is not in acute distress.    Appearance: Normal appearance. He is well-developed. He is obese. He is not ill-appearing, toxic-appearing or diaphoretic.     Interventions: Face mask in place.  HENT:     Head: Normocephalic  and atraumatic.     Jaw: No trismus.     Right Ear: External ear normal.     Left Ear: External ear normal.  Eyes:     General: Lids are normal. No scleral icterus.    Conjunctiva/sclera: Conjunctivae normal.     Pupils: Pupils are equal, round, and reactive to light.  Neck:     Trachea: Trachea and phonation normal. No tracheal deviation.  Cardiovascular:     Rate and Rhythm: Normal rate and regular rhythm.     Pulses: Normal pulses.          Radial pulses are 2+ on the right side and 2+ on the left side.       Posterior tibial pulses are 2+ on the right side and 2+ on the left side.     Heart sounds: Normal heart sounds. No murmur. No friction rub. No gallop.   Pulmonary:     Effort: Pulmonary effort is normal. No respiratory distress.     Breath sounds: Normal breath sounds. No stridor. No wheezing, rhonchi or rales.  Abdominal:     General: Bowel sounds are normal. There is no distension.     Palpations: Abdomen is soft.     Tenderness: There is no abdominal tenderness. There is no guarding or rebound.  Musculoskeletal:        General: Normal range of motion.     Cervical back: Normal range of motion and neck supple.     Right lower leg: No edema.     Left lower leg: No edema.  Skin:  General: Skin is warm and dry.     Capillary Refill: Capillary refill takes less than 2 seconds.     Coloration: Skin is not jaundiced.     Findings: Lesion (oval 8 x 21m lesion to right cheek, raised mildly erythematous, peeling) present. No rash.     Nails: There is no clubbing.  Neurological:     Mental Status: He is alert.     Cranial Nerves: No dysarthria or facial asymmetry.     Motor: No tremor or abnormal muscle tone.     Gait: Gait normal.  Psychiatric:        Mood and Affect: Mood normal.        Speech: Speech normal.        Behavior: Behavior normal. Behavior is cooperative.     No results found for this or any previous visit (from the past 2160  hour(s)).  PHQ2/9: [facility] 02/09/2020 02/06/2019 12/22/2018 03/07/2016  Decreased Interest 0 0 0 0  Down, Depressed, Hopeless 0 0 1 0  PHQ - 2 Score 0 0 1 0  Altered sleeping 0 0 2 1  Tired, decreased energy 0 0 2 1  Change in appetite 0 0 1 0  Feeling bad or failure about yourself  0 0 0 0  Trouble concentrating 0 0 0 0  Moving slowly or fidgety/restless 0 0 0 0  Suicidal thoughts 0 0 0 0  PHQ-9 Score 0 0 6 2  Difficult doing work/chores Not difficult at all Not difficult at all Somewhat difficult -    Fall Risk: Fall Risk  02/09/2020 03/17/2019 03/17/2019 02/06/2019 01/01/2019  Falls in the past year? 0 0 0 0 0  Number falls in past yr: 0 0 - 0 0  Injury with Fall? 0 0 - 0 0    Functional Status Survey: Is the patient deaf or have difficulty hearing?: No Does the patient have difficulty seeing, even when wearing glasses/contacts?: No Does the patient have difficulty concentrating, remembering, or making decisions?: No Does the patient have difficulty walking or climbing stairs?: No Does the patient have difficulty dressing or bathing?: No Does the patient have difficulty doing errands alone such as visiting a doctor's office or shopping?: No   Assessment & Plan:    CPE completed today   Prostate cancer screening and PSA options (with potential risks and benefits of testing vs not testing) were discussed along with recent recs/guidelines, shared decision making and handout/information given to pt today   USPSTF grade A and B recommendations reviewed with patient; age-appropriate recommendations, preventive care, screening tests, etc discussed and encouraged; healthy living encouraged; see AVS for patient education given to patient   Discussed importance of 150 minutes of physical activity weekly, AHA exercise recommendations given to pt in AVS/handout   Discussed importance of healthy diet:  eating lean meats and proteins, avoiding trans fats and saturated fats,  avoid simple sugars and excessive carbs in diet, eat 6 servings of fruit/vegetables daily and drink plenty of water and avoid sweet beverages.  DASH diet reviewed if pt has HTN   Recommended pt to do annual eye exam and routine dental exams/cleanings   Reviewed Health Maintenance: Health Maintenance  Topic Date Due   INFLUENZA VACCINE  03/30/2020 (Originally 08/01/2019)   TETANUS/TDAP  01/07/2022   HIV Screening  Completed     Immunizations: Immunization History  Administered Date(s) Administered   Tdap 01/08/2012      ICD-10-CM   1. Adult general  medical exam  Z00.00 CBC with Differential/Platelet    COMPLETE METABOLIC PANEL WITH GFR    Lipid panel    Hemoglobin A1c  2. Snoring  R06.83 Ambulatory referral to Sleep Studies  3. Screening for endocrine, metabolic and immunity disorder  Z13.29    Z13.228    Z13.0   4. Screening for lipoid disorders  Z13.220   5. Suspected sleep apnea  R29.818 Ambulatory referral to Sleep Studies   refer  6. Elevated blood-pressure reading without diagnosis of hypertension  R03.0    home monitoring, if >130/80 will start tx, elevated when anxious, repeated in clinic and had improved  7. Lesion of face  L98.9 Ambulatory referral to Dermatology   right cheek raised lesion burning dry, refer to derm  8. Class 1 obesity with body mass index (BMI) of 33.0 to 33.9 in adult, unspecified obesity type, unspecified whether serious comorbidity present  E66.9 Ambulatory referral to Sleep Studies   Z68.33 Lipid panel    Hemoglobin A1c        Delsa Grana, PA-C 02/09/20 6:30 PM  [facility]

## 2020-02-10 ENCOUNTER — Other Ambulatory Visit: Payer: Self-pay | Admitting: Family Medicine

## 2020-02-10 ENCOUNTER — Encounter: Payer: Self-pay | Admitting: Family Medicine

## 2020-02-10 DIAGNOSIS — R7303 Prediabetes: Secondary | ICD-10-CM | POA: Insufficient documentation

## 2020-02-10 DIAGNOSIS — E785 Hyperlipidemia, unspecified: Secondary | ICD-10-CM

## 2020-02-10 LAB — CBC WITH DIFFERENTIAL/PLATELET
Absolute Monocytes: 474 cells/uL (ref 200–950)
Basophils Absolute: 41 cells/uL (ref 0–200)
Basophils Relative: 0.8 %
Eosinophils Absolute: 92 cells/uL (ref 15–500)
Eosinophils Relative: 1.8 %
HCT: 43.1 % (ref 38.5–50.0)
Hemoglobin: 14.8 g/dL (ref 13.2–17.1)
Lymphs Abs: 1897 cells/uL (ref 850–3900)
MCH: 30.1 pg (ref 27.0–33.0)
MCHC: 34.3 g/dL (ref 32.0–36.0)
MCV: 87.8 fL (ref 80.0–100.0)
MPV: 11.1 fL (ref 7.5–12.5)
Monocytes Relative: 9.3 %
Neutro Abs: 2596 cells/uL (ref 1500–7800)
Neutrophils Relative %: 50.9 %
Platelets: 214 10*3/uL (ref 140–400)
RBC: 4.91 10*6/uL (ref 4.20–5.80)
RDW: 12.7 % (ref 11.0–15.0)
Total Lymphocyte: 37.2 %
WBC: 5.1 10*3/uL (ref 3.8–10.8)

## 2020-02-10 LAB — COMPLETE METABOLIC PANEL WITH GFR
AG Ratio: 1.6 (calc) (ref 1.0–2.5)
ALT: 43 U/L (ref 9–46)
AST: 21 U/L (ref 10–40)
Albumin: 4.5 g/dL (ref 3.6–5.1)
Alkaline phosphatase (APISO): 74 U/L (ref 36–130)
BUN: 14 mg/dL (ref 7–25)
CO2: 29 mmol/L (ref 20–32)
Calcium: 9.3 mg/dL (ref 8.6–10.3)
Chloride: 103 mmol/L (ref 98–110)
Creat: 0.78 mg/dL (ref 0.60–1.35)
GFR, Est African American: 127 mL/min/{1.73_m2} (ref >=60)
GFR, Est Non African American: 110 mL/min/{1.73_m2} (ref >=60)
Globulin: 2.9 g/dL (calc) (ref 1.9–3.7)
Glucose, Bld: 85 mg/dL (ref 65–99)
Potassium: 4.3 mmol/L (ref 3.5–5.3)
Sodium: 139 mmol/L (ref 135–146)
Total Bilirubin: 0.4 mg/dL (ref 0.2–1.2)
Total Protein: 7.4 g/dL (ref 6.1–8.1)

## 2020-02-10 LAB — HEMOGLOBIN A1C
Hgb A1c MFr Bld: 5.7 % of total Hgb — ABNORMAL HIGH (ref <5.7)
Mean Plasma Glucose: 117 (calc)
eAG (mmol/L): 6.5 (calc)

## 2020-02-10 LAB — LIPID PANEL
Cholesterol: 251 mg/dL — ABNORMAL HIGH (ref <200)
HDL: 45 mg/dL (ref >=40)
LDL Cholesterol (Calc): 171 mg/dL (calc) — ABNORMAL HIGH
Non-HDL Cholesterol (Calc): 206 mg/dL (calc) — ABNORMAL HIGH (ref <130)
Total CHOL/HDL Ratio: 5.6 (calc) — ABNORMAL HIGH (ref <5.0)
Triglycerides: 195 mg/dL — ABNORMAL HIGH (ref <150)

## 2020-03-08 ENCOUNTER — Telehealth: Payer: Self-pay

## 2020-03-08 DIAGNOSIS — R0683 Snoring: Secondary | ICD-10-CM

## 2020-03-08 NOTE — Telephone Encounter (Signed)
Please clarify are you sending for sleep study or do you want to send him to the specialist to have them order the sleep study and review results?

## 2020-03-08 NOTE — Telephone Encounter (Signed)
Copied from [place] [id number]. Topic: Referral - Question >> Mar 08, 2020 11:51 AM Percell Belt A wrote: Reason for CRM:  pt called In and stated that [facility] has faxed over 3 times a order form that needs to be filled out for the sleep study? He stated it was sent the 15th 18th and 24th.   Cb number for Sharyn Lull [phone] ext [phone] He stated this would be cancelled it if the order is not completed  Please advise

## 2020-03-09 NOTE — Telephone Encounter (Signed)
I have not seen order, so do you want me to call to get or are you going to refer him to specialist?

## 2020-04-18 ENCOUNTER — Institutional Professional Consult (permissible substitution): Payer: Commercial Managed Care - PPO | Admitting: Acute Care

## 2020-06-05 DIAGNOSIS — G4733 Obstructive sleep apnea (adult) (pediatric): Secondary | ICD-10-CM | POA: Insufficient documentation

## 2021-02-09 ENCOUNTER — Encounter: Payer: Self-pay | Admitting: Family Medicine

## 2021-02-09 ENCOUNTER — Ambulatory Visit (INDEPENDENT_AMBULATORY_CARE_PROVIDER_SITE_OTHER): Payer: Commercial Managed Care - PPO | Admitting: Family Medicine

## 2021-02-09 ENCOUNTER — Other Ambulatory Visit: Payer: Self-pay

## 2021-02-09 VITALS — BP 130/78 | HR 84 | Temp 98.2°F | Resp 16 | Ht 71.0 in | Wt 253.0 lb

## 2021-02-09 DIAGNOSIS — Z6835 Body mass index (BMI) 35.0-35.9, adult: Secondary | ICD-10-CM

## 2021-02-09 DIAGNOSIS — E785 Hyperlipidemia, unspecified: Secondary | ICD-10-CM

## 2021-02-09 DIAGNOSIS — Z1211 Encounter for screening for malignant neoplasm of colon: Secondary | ICD-10-CM | POA: Diagnosis not present

## 2021-02-09 DIAGNOSIS — M25561 Pain in right knee: Secondary | ICD-10-CM

## 2021-02-09 DIAGNOSIS — Z125 Encounter for screening for malignant neoplasm of prostate: Secondary | ICD-10-CM

## 2021-02-09 DIAGNOSIS — E669 Obesity, unspecified: Secondary | ICD-10-CM

## 2021-02-09 DIAGNOSIS — R8290 Unspecified abnormal findings in urine: Secondary | ICD-10-CM

## 2021-02-09 DIAGNOSIS — Z9989 Dependence on other enabling machines and devices: Secondary | ICD-10-CM

## 2021-02-09 DIAGNOSIS — J329 Chronic sinusitis, unspecified: Secondary | ICD-10-CM

## 2021-02-09 DIAGNOSIS — Z0000 Encounter for general adult medical examination without abnormal findings: Secondary | ICD-10-CM

## 2021-02-09 DIAGNOSIS — Z1159 Encounter for screening for other viral diseases: Secondary | ICD-10-CM

## 2021-02-09 DIAGNOSIS — Z13220 Encounter for screening for lipoid disorders: Secondary | ICD-10-CM

## 2021-02-09 DIAGNOSIS — H9203 Otalgia, bilateral: Secondary | ICD-10-CM

## 2021-02-09 DIAGNOSIS — F411 Generalized anxiety disorder: Secondary | ICD-10-CM

## 2021-02-09 DIAGNOSIS — R7303 Prediabetes: Secondary | ICD-10-CM

## 2021-02-09 DIAGNOSIS — G4733 Obstructive sleep apnea (adult) (pediatric): Secondary | ICD-10-CM

## 2021-02-09 DIAGNOSIS — F439 Reaction to severe stress, unspecified: Secondary | ICD-10-CM

## 2021-02-09 LAB — POCT URINALYSIS DIPSTICK
Bilirubin, UA: NEGATIVE
Blood, UA: NEGATIVE
Glucose, UA: NEGATIVE
Ketones, UA: NEGATIVE
Leukocytes, UA: NEGATIVE
Nitrite, UA: NEGATIVE
Protein, UA: NEGATIVE
Spec Grav, UA: 1.015 (ref 1.010–1.025)
Urobilinogen, UA: NEGATIVE E.U./dL — AB
pH, UA: 7 (ref 5.0–8.0)

## 2021-02-09 MED ORDER — NEOMYCIN-POLYMYXIN-HC 3.5-10000-1 OT SOLN: 4.0000 [drp] | Freq: Four times a day (QID) | OTIC | 0 refills | Status: AC

## 2021-02-09 MED ORDER — PREDNISONE 20 MG PO TABS: 40.0000 mg | ORAL_TABLET | Freq: Every day | ORAL | 0 refills | Status: AC

## 2021-02-09 NOTE — Progress Notes (Signed)
Patient: Martin Parks, Male    DOB: 1975/12/10, 46 y.o.   MRN: [id number] Martin Grana, PA-C Visit Date: 02/09/2021  Today's Provider: Delsa Grana, PA-C   Chief Complaint  Patient presents with  . Annual Exam   Subjective:   Annual physical exam:  Martin Parks is a 46 y.o. male who presents today for health maintenance and annual & complete physical exam.   Exercise/Activity: Exercising about 4 days a week for 20 minutes each time Diet/nutrition: Trying to work on healthy diet Sleep: sleeping better 8 hours   Got sleep eval OSA with [facility] Dr. Druscilla Parks -was diagnosed with moderate obstructive sleep apnea, Got CPAP in June he feels that his blood pressure has improved and sleep has improved and is more restful  A lot of stress in life, sick friends, sister with glioblastoma, bad summer He expresses frustration with Martin Parks and the health care system - anxious and stressed today   Martin Parks -sees patient for ENT ear follow-up  Pt wished to do routine f/up on chronic conditions today in addition to CPE. Advised pt of separate visit billing/coding  USPSTF grade A and B recommendations - reviewed and addressed today  Depression:  Phq 9 completed today by patient, was reviewed by me with patient in the room, score is  negative, pt feels mood good, but stressed  PHQ 2/9 Scores 02/09/2021 02/09/2020 02/06/2019 12/22/2018  PHQ - 2 Score 0 0 0 1  PHQ- 9 Score 0 0 0 6   Depression screen [facility] 2/9 02/09/2021 02/09/2020 02/06/2019 12/22/2018 03/07/2016  Decreased Interest 0 0 0 0 0  Down, Depressed, Hopeless 0 0 0 1 0  PHQ - 2 Score 0 0 0 1 0  Altered sleeping 0 0 0 2 1  Tired, decreased energy 0 0 0 2 1  Change in appetite 0 0 0 1 0  Feeling bad or failure about yourself  0 0 0 0 0  Trouble concentrating 0 0 0 0 0  Moving slowly or fidgety/restless 0 0 0 0 0  Suicidal thoughts 0 0 0 0 0  PHQ-9 Score 0 0 0 6 2  Difficult doing work/chores - Not difficult at all Not difficult at  all Somewhat difficult -    Hep C Screening: due STD testing and prevention (HIV/chl/gon/syphilis):   Intimate partner violence:  safe Prostate cancer:  Father and grandpa have enlarged prostate  Prostate cancer screening with PSA: Discussed risks and benefits of PSA testing and provided handout. Pt wishes to have PSA drawn today.  Lab Results  Component Value Date   PSA 0.4 09/25/2018     Urinary Symptoms:   IPSS    Row Name 02/09/21 1118         International Prostate Symptom Score   How often have you had the sensation of not emptying your bladder? Less than half the time     How often have you had to urinate less than every two hours? Less than 1 in 5 times     How often have you found you stopped and started again several times when you urinated? Less than 1 in 5 times     How often have you found it difficult to postpone urination? Not at All     How often have you had a weak urinary stream? Not at All     How often have you had to strain to start urination? Not at All     How many times did  you typically get up at night to urinate? 1 Time     Total IPSS Score 5           Quality of Life due to urinary symptoms   If you were to spend the rest of your life with your urinary condition just the way it is now how would you feel about that? Mostly Satisfied           Advanced Care Planning:  A voluntary discussion about advance care planning including the explanation and discussion of advance directives.  Discussed health care proxy and Living will, and the patient was able to identify a health care proxy as wife.  Patient does not have a living will at present time. If patient does have living will, I have requested they bring this to the clinic to be scanned in to their chart.  Health Maintenance  Topic Date Due  . Hepatitis C Screening  Never done  . COLONOSCOPY (Pts 45-12yr Insurance coverage will need to be confirmed)  Never done  . COVID-19 Vaccine (1) 02/25/2021  (Originally [birth year]/1981)  . INFLUENZA VACCINE  03/30/2021 (Originally 07/31/2020)  . TETANUS/TDAP  01/07/2022  . HIV Screening  Completed     Skin cancer:  Pt reports no hx of skin cancer, suspicious lesions/biopsies in the past.  Sees dermatology annually   Colorectal cancer:  colonoscopy is due - discussion with pt today - he wants cologuard Pt denies melena hematochezia change in bowels  Lung cancer:   Low Dose CT Chest recommended if Age 45-80years, 20 pack-year currently smoking OR have quit w/in 15years. Patient does not qualify.   Social History   Tobacco Use  . Smoking status: Never Smoker  . Smokeless tobacco: Never Used  Substance Use Topics  . Alcohol use: Yes    Alcohol/week: 0.0 standard drinks    Comment: rare     [place] [facility] 1      AAA: n/a The USPSTF recommends one-time screening with ultrasonography in men ages 663to 782years who have ever smoked  ECG: Not indicated today  Blood pressure/Hypertension:   Elevated blood pressure in the past he believes this has improved with CPAP and treatment of OSA, blood pressure at goal today even with increased weight BP Readings from Last 3 Encounters:  02/09/21 130/78  02/09/20 132/84  03/17/19 (!) 157/84   Weight/Obesity: Wt Readings from Last 3 Encounters:  02/09/21 253 lb (114.8 kg)  02/09/20 241 lb 4.8 oz (109.5 kg)  03/17/19 232 lb 3.2 oz (105.3 kg)   BMI Readings from Last 3 Encounters:  02/09/21 35.29 kg/m  02/09/20 33.65 kg/m  03/17/19 32.85 kg/m    Lipids:  Hyperlipidemia LDL very high but not currently on medications Lab Results  Component Value Date   CHOL 251 (H) 02/09/2020   CHOL 222 (H) 12/22/2018   CHOL 212 (A) 09/25/2018   Lab Results  Component Value Date   HDL 45 02/09/2020   HDL 42 12/22/2018   HDL 45 09/25/2018   Lab Results  Component Value Date   LDLCALC 171 (H) 02/09/2020    LDLCALC 144 (H) 12/22/2018   LDLCALC 144 09/25/2018   Lab Results  Component Value Date   TRIG 195 (H) 02/09/2020   TRIG 222 (H) 12/22/2018   TRIG 116 09/25/2018   Lab Results  Component Value Date   CHOLHDL 5.6 (H) 02/09/2020   CHOLHDL 5.3 (  H) 12/22/2018   No results found for: LDLDIRECT Based on the results of lipid panel his/her cardiovascular risk factor ( using [facility] )  in the next 10 years is : The 10-year ASCVD risk score Mikey Bussing DC Brooke Bonito., et al., 2013) is: 3.5%   Values used to calculate the score:     Age: 39 years     Sex: Male     Is Non-Hispanic African American: No     Diabetic: No     Tobacco smoker: No     Systolic Blood Pressure: 825 mmHg     Is BP treated: No     HDL Cholesterol: 45 mg/dL     Total Cholesterol: 251 mg/dL Glucose:  Glucose  Date Value Ref Range Status  03/07/2016 80 65 - 99 mg/dL Final  01/27/2013 128 (H) 65 - 99 mg/dL Final   Glucose, Bld  Date Value Ref Range Status  02/09/2020 85 65 - 99 mg/dL Final    Comment:    .            Fasting reference interval .   12/22/2018 81 65 - 139 mg/dL Final    Comment:    .        Non-fasting reference interval .     Social History      He  reports that he has never smoked. He has never used smokeless tobacco. He reports current alcohol use. He reports that he does not use drugs.       Social History   Socioeconomic History  . Marital status: Married    Spouse name: Willette Brace  . Number of children: 2  . Years of education: 63  . Highest education level: Bachelor's degree (e.g., BA, AB, BS)  Occupational History  . Not on file  Tobacco Use  . Smoking status: Never Smoker  . Smokeless tobacco: Never Used  Vaping Use  . Vaping Use: Never used  Substance and Sexual Activity  . Alcohol use: Yes    Alcohol/week: 0.0 standard drinks    Comment: rare  . Drug use: No  . Sexual activity: Yes    Partners: Female  Other Topics Concern  . Not on file  Social History Narrative   . Not on file   Social Determinants of Health   Financial Resource Strain: Unknown  . Difficulty of Paying Living Expenses: Patient refused  Food Insecurity: Not on file  Transportation Needs: Unknown  . Lack of Transportation (Medical): Patient refused  . Lack of Transportation (Non-Medical): Patient refused  Physical Activity: Insufficiently Active  . Days of Exercise per Week: 4 days  . Minutes of Exercise per Session: 20 min  Stress: Stress Concern Present  . Feeling of Stress : Rather much  Social Connections: Unknown  . Frequency of Communication with Friends and Family: Twice a week  . Frequency of Social Gatherings with Friends and Family: Once a week  . Attends Religious Services: Patient refused  . Active Member of Clubs or Organizations: Yes  . Attends Archivist Meetings: More than 4 times per year  . Marital Status: Married     Family History        Family Status  Relation Name Status  . Mother  Alive  . Father  Alive  . Sister  Alive  . Sister  Alive  . Sister  Alive  . Daughter  Alive  . Son  Alive        His family history  includes Cancer in his sister.       Family History  Problem Relation Age of Onset  . Cancer Sister        breast    Patient Active Problem List   Diagnosis Date Noted  . Class 2 severe obesity with serious comorbidity and body mass index (BMI) of 35.0 to 35.9 in adult [facility]) 02/09/2021  . OSA on CPAP 06/05/2020  . Prediabetes 02/10/2020  . Preventative health care 02/08/2019  . Hyperactivity 12/22/2018  . Generalized anxiety disorder 12/22/2018  . Obesity (BMI 30.0-34.9) 12/22/2018  . Hyperlipidemia LDL goal <100 12/22/2018    Past Surgical History:  Procedure Laterality Date  . HERNIA REPAIR     inguinal  . TONSILLECTOMY    . UMBILICAL HERNIA REPAIR N/A 03/02/2019   Procedure: OPEN HERNIA REPAIR UMBILICAL ADULT WITH MESH;  Surgeon: Vickie Epley, MD;  Location: [facility] ORS;  Service: General;  Laterality:  N/A;     Current Outpatient Medications:  .  neomycin-polymyxin-hydrocortisone (CORTISPORIN) OTIC solution, Place 4 drops into both ears 4 (four) times daily for 7 days., Disp: 10 mL, Rfl: 0 .  predniSONE (DELTASONE) 20 MG tablet, Take 2 tablets (40 mg total) by mouth daily with breakfast for 5 days., Disp: 10 tablet, Rfl: 0 .  ALPRAZolam (XANAX) 0.5 MG tablet, Take 0.5 mg by mouth 2 (two) times daily as needed for anxiety. , Disp: , Rfl: 5 .  b complex vitamins capsule, Take 1 capsule by mouth daily., Disp: , Rfl:  .  cholecalciferol (VITAMIN D3) 25 MCG (1000 UNIT) tablet, Take 5,000 Units by mouth daily., Disp: , Rfl:  .  desvenlafaxine (PRISTIQ) 100 MG 24 hr tablet, Take 100 mg by mouth every morning. , Disp: , Rfl:  .  fluticasone (FLONASE) 50 MCG/ACT nasal spray, SHAKE WELL AND USE 2 SPRAYS IN EACH NOSTRIL DAILY (Patient taking differently: Place 2 sprays into both nostrils daily. ), Disp: 16 g, Rfl: 12 .  loratadine (CLARITIN) 10 MG tablet, Take by mouth., Disp: , Rfl:  .  Misc Natural Products (OSTEO BI-FLEX ADV TRIPLE ST) TABS, Take 1 tablet by mouth daily., Disp: , Rfl:  .  Omega-3 Fatty Acids (FISH OIL) 1000 MG CAPS, Take 1,000 mg by mouth daily. , Disp: , Rfl:  .  Turmeric 500 MG CAPS, Take 500 mg by mouth daily. , Disp: , Rfl:   Allergies  Allergen Reactions  . Adhesive [Tape] Rash    Patient Care Team: Martin Grana, PA-C as PCP - General (Family Medicine)   Chart Review: I personally reviewed active problem list, medication list, allergies, family history, social history, health maintenance, notes from last encounter, lab results, imaging with the patient/caregiver today.   Review of Systems  Constitutional: Negative.   HENT: Negative.   Eyes: Negative.   Respiratory: Negative.   Cardiovascular: Negative.   Gastrointestinal: Negative.   Endocrine: Negative.   Genitourinary: Negative.   Musculoskeletal: Negative.   Skin: Negative.   Allergic/Immunologic: Negative.    Neurological: Negative.   Hematological: Negative.   Psychiatric/Behavioral: Negative.   All other systems reviewed and are negative.         Objective:   Vitals:  Vitals:   02/09/21 1028  BP: 130/78  Pulse: 84  Resp: 16  Temp: 98.2 F (36.8 C)  SpO2: 98%  Weight: 253 lb (114.8 kg)  Height: 5' 11"  (1.803 m)    Body mass index is 35.29 kg/m.  Physical Exam Vitals and nursing note reviewed.  Constitutional:      General: He is not in acute distress.    Appearance: Normal appearance. He is well-developed. He is obese. He is not ill-appearing, toxic-appearing or diaphoretic.     Interventions: Face mask in place.  HENT:     Head: Normocephalic and atraumatic. No right periorbital erythema or left periorbital erythema.     Jaw: No trismus.     Right Ear: External ear normal. Swelling present. No middle ear effusion. No mastoid tenderness. Tympanic membrane is retracted. Tympanic membrane is not injected, perforated or erythematous.     Left Ear: External ear normal. Swelling present. There is no impacted cerumen. No mastoid tenderness. Tympanic membrane is not injected, perforated, erythematous or retracted.     Ears:     Comments: Bilateral canals edematous and erythematous    Nose: Mucosal edema, congestion and rhinorrhea present.     Right Sinus: No maxillary sinus tenderness or frontal sinus tenderness.     Left Sinus: No maxillary sinus tenderness or frontal sinus tenderness.     Mouth/Throat:     Mouth: Mucous membranes are moist.  Eyes:     General: Lids are normal. No scleral icterus.       Right eye: No discharge.        Left eye: No discharge.     Conjunctiva/sclera: Conjunctivae normal.  Neck:     Trachea: Trachea and phonation normal. No tracheal deviation.  Cardiovascular:     Rate and Rhythm: Normal rate and regular rhythm.     Pulses: Normal pulses.          Radial pulses are 2+ on the right side and 2+ on the left side.       Posterior tibial pulses  are 2+ on the right side and 2+ on the left side.     Heart sounds: Normal heart sounds. No murmur heard. No friction rub. No gallop.   Pulmonary:     Effort: Pulmonary effort is normal. No respiratory distress.     Breath sounds: Normal breath sounds. No stridor. No wheezing, rhonchi or rales.  Abdominal:     General: Bowel sounds are normal. There is no distension.     Palpations: Abdomen is soft.  Musculoskeletal:     Right lower leg: No edema.     Left lower leg: No edema.  Skin:    General: Skin is warm and dry.     Coloration: Skin is not jaundiced.     Findings: No rash.     Nails: There is no clubbing.  Neurological:     Mental Status: He is alert. Mental status is at baseline.     Cranial Nerves: No dysarthria or facial asymmetry.     Motor: No weakness, tremor or abnormal muscle tone.     Coordination: Coordination is intact.     Gait: Gait is intact. Gait normal.  Psychiatric:        Attention and Perception: Attention normal.        Mood and Affect: Mood and affect normal.        Speech: Speech normal.        Behavior: Behavior is hyperactive. Behavior is cooperative.        Thought Content: Thought content normal. Thought content does not include homicidal or suicidal ideation. Thought content does not include homicidal or suicidal plan.      Recent Results (from the past 2160 hour(s))  POCT urinalysis dipstick     Status: Abnormal  Collection Time: 02/09/21 11:27 AM  Result Value Ref Range   Color, UA yellow    Clarity, UA clear    Glucose, UA Negative Negative   Bilirubin, UA negative    Ketones, UA negative    Spec Grav, UA 1.015 1.010 - 1.025   Blood, UA negative    pH, UA 7.0 5.0 - 8.0   Protein, UA Negative Negative   Urobilinogen, UA negative (A) 0.2 or 1.0 E.U./dL   Nitrite, UA negative    Leukocytes, UA Negative Negative   Appearance clear    Odor none      Fall Risk: Fall Risk  02/09/2021 02/09/2020 03/17/2019 03/17/2019 02/06/2019  Falls in  the past year? 0 0 0 0 0  Number falls in past yr: 0 0 0 - 0  Injury with Fall? 0 0 0 - 0    Functional Status Survey: Is the patient deaf or have difficulty hearing?: Yes Does the patient have difficulty seeing, even when wearing glasses/contacts?: No Does the patient have difficulty concentrating, remembering, or making decisions?: No Does the patient have difficulty walking or climbing stairs?: No Does the patient have difficulty dressing or bathing?: No Does the patient have difficulty doing errands alone such as visiting a doctor's office or shopping?: No   Assessment & Plan:    CPE completed today  . Prostate cancer screening and PSA options (with potential risks and benefits of testing vs not testing) were discussed along with recent recs/guidelines, shared decision making and handout/information given to pt today  . USPSTF grade A and B recommendations reviewed with patient; age-appropriate recommendations, preventive care, screening tests, etc discussed and encouraged; healthy living encouraged; see AVS for patient education given to patient  . Discussed importance of 150 minutes of physical activity weekly, AHA exercise recommendations given to pt in AVS/handout  . Discussed importance of healthy diet:  eating lean meats and proteins, avoiding trans fats and saturated fats, avoid simple sugars and excessive carbs in diet, eat 6 servings of fruit/vegetables daily and drink plenty of water and avoid sweet beverages.  DASH diet reviewed if pt has HTN  . Recommended pt to do annual eye exam and routine dental exams/cleanings  . Reviewed Health Maintenance: Health Maintenance  Topic Date Due  . Hepatitis C Screening  Never done  . COLONOSCOPY (Pts 45-55yr Insurance coverage will need to be confirmed)  Never done  . COVID-19 Vaccine (1) 02/25/2021 (Originally [birth year]1981)  . INFLUENZA VACCINE  03/30/2021 (Originally 07/31/2020)  . TETANUS/TDAP  01/07/2022  . HIV Screening   Completed    . Immunizations: Immunization History  Administered Date(s) Administered  . Tdap 01/08/2012     ICD-10-CM   1. Adult general medical exam  Z00.00 CBC w/Diff/Platelet    COMPLETE METABOLIC PANEL WITH GFR     Lipid panel  2. Screening for colon cancer  Z12.11 Cologuard   Discussed colon cancer screening options he would like to do Cologuard, low risk with family history and no current symptoms   3. Encounter for hepatitis C screening test for low risk patient   Z11.59 Hepatitis C Antibody  4. Hyperlipidemia LDL goal <100  ED53.2COMPLETE METABOLIC PANEL WITH GFR    Lipid panel   Recheck lipid panel and recalculate ASCVD risk score   5. Prediabetes  RD92.42COMPLETE METABOLIC PANEL WITH GFR    Hemoglobin A1C   Recheck labs, concern for risk to develop type 2 diabetes with increased weight Lab Results  Component Value Date  HGBA1C 5.7 (H) 02/09/2020     6. Class 2 severe obesity with serious comorbidity and body mass index (BMI) of 35.0 to 35.9 in adult, unspecified obesity type (HCC)  E66.01 CBC w/Diff/Platelet   F75.88 COMPLETE METABOLIC PANEL WITH GFR    Lipid panel    Hemoglobin A1C   Weight increased with stress of life and knee problems encouraged healthy diet and physical activity as able   7. OSA on CPAP  G47.33 CBC w/Diff/Platelet   T25.49 COMPLETE METABOLIC PANEL WITH GFR    Lipid panel    Hemoglobin A1C   He was able to establish with a sleep specialist at [facility] testing revealed moderate OSA now on CPAP and feels much better   8. Screening for malignant neoplasm of prostate  Z12.5 PSA   He requests early screening for prostate because of BPH and family, we did discuss PSA screening recommendations and interpretation   9. Right knee pain, unspecified chronicity  M25.561 Ambulatory referral to Orthopedics   Patient has had intermittent right knee pain for a few months but many years of joint issues request Ortho referral Patient gives history- acute on  chronic right knee pain - pain located anterior, lateral, worse with stairs, sharp and burning, no recent injury/strain, onset 6+ months ago, lifetime of knee issues since he was a child Exam unremarkable today no bony tenderness no edema or effusion, normal gait Per patient request, referred to Ortho   10. Abnormal urine odor  R82.90 POCT urinalysis dipstick   Some darkening and odor to urine no blood no pain, screen urine Urine dip was unremarkable do suspect that his changes urine appearance and odor likely due to several vitamins and supplements he takes   11. Situational stress  F43.9    He has had a lot of personal and family stressors occurring over the last several months   12. Generalized anxiety disorder  F41.1    He sees a psychiatrist is on Pristiq and Xanax         13. Chronic sinusitis, unspecified location  J32.9 predniSONE (DELTASONE) 20 MG tablet    loratadine (CLARITIN) 10 MG tablet   Acute on chronic patient sometimes has required steroids and/or antibiotics, no sinus tenderness no fever will give steroids/discussed side effects He does not have any severe pain or fever I do not feel that antibiotics are currently indicated but encouraged him to continue to use over-the-counter medications such as decongestants, steroid nasal sprays, saline nasal spray, antihistamines Follow-up if not improving in 1-2 weeks or with any acute worsening He does note that he is going to follow-up with his ENT in about 3 weeks    14. Otalgia of both ears  H92.03 neomycin-polymyxin-hydrocortisone (CORTISPORIN) OTIC solution    predniSONE (DELTASONE) 20 MG tablet    loratadine (CLARITIN) 10 MG tablet   Suspect eustachian tube dysfunction recently worsened because of recent URI, canals are b/l edematous and erythematous use Rx  drops and decongestant       Martin Grana, PA-C 02/09/21 5:27 PM  [facility]

## 2021-02-09 NOTE — Patient Instructions (Signed)
Health Maintenance  Topic Date Due  .  Hepatitis C: One time screening is recommended by Center for Disease Control  (CDC) for  adults born from [birth year] through 1964.   Never done  . Colon Cancer Screening  Never done  . COVID-19 Vaccine (1) 02/25/2021*  . Flu Shot  03/30/2021*  . Tetanus Vaccine  01/07/2022  . HIV Screening  Completed  *Topic was postponed. The date shown is not the original due date.     Preventive Care 40-45 Years Old, Male Preventive care refers to lifestyle choices and visits with your health care provider that can promote health and wellness. This includes:  A yearly physical exam. This is also called an annual wellness visit.  Regular dental and eye exams.  Immunizations.  Screening for certain conditions.  Healthy lifestyle choices, such as: ? Eating a healthy diet. ? Getting regular exercise. ? Not using drugs or products that contain nicotine and tobacco. ? Limiting alcohol use. What can I expect for my preventive care visit? Physical exam Your health care provider will check your:  Height and weight. These may be used to calculate your BMI (body mass index). BMI is a measurement that tells if you are at a healthy weight.  Heart rate and blood pressure.  Body temperature.  Skin for abnormal spots. Counseling Your health care provider may ask you questions about your:  Past medical problems.  Family's medical history.  Alcohol, tobacco, and drug use.  Emotional well-being.  Home life and relationship well-being.  Sexual activity.  Diet, exercise, and sleep habits.  Work and work Statistician.  Access to firearms. What immunizations do I need? Vaccines are usually given at various ages, according to a schedule. Your health care provider will recommend vaccines for you based on your age, medical history, and lifestyle or other factors, such as travel or where you work.   What tests do I need? Blood tests  Lipid and cholesterol  levels. These may be checked every 5 years, or more often if you are over 21 years old.  Hepatitis C test.  Hepatitis B test. Screening  Lung cancer screening. You may have this screening every year starting at age 49 if you have a 30-pack-year history of smoking and currently smoke or have quit within the past 15 years.  Prostate cancer screening. Recommendations will vary depending on your family history and other risks.  Genital exam to check for testicular cancer or hernias.  Colorectal cancer screening. ? All adults should have this screening starting at age 77 and continuing until age 45. ? Your health care provider may recommend screening at age 31 if you are at increased risk. ? You will have tests every 1-10 years, depending on your results and the type of screening test.  Diabetes screening. ? This is done by checking your blood sugar (glucose) after you have not eaten for a while (fasting). ? You may have this done every 1-3 years.  STD (sexually transmitted disease) testing, if you are at risk. Follow these instructions at home: Eating and drinking  Eat a diet that includes fresh fruits and vegetables, whole grains, lean protein, and low-fat dairy products.  Take vitamin and mineral supplements as recommended by your health care provider.  Do not drink alcohol if your health care provider tells you not to drink.  If you drink alcohol: ? Limit how much you have to 0-2 drinks a day. ? Be aware of how much alcohol is in your drink.  In the [place] one drink equals one 12 oz bottle of beer (355 mL), one 5 oz glass of wine (148 mL), or one 1 oz glass of hard liquor (44 mL).   Lifestyle  Take daily care of your teeth and gums. Brush your teeth every morning and night with fluoride toothpaste. Floss one time each day.  Stay active. Exercise for at least 30 minutes 5 or more days each week.  Do not use any products that contain nicotine or tobacco, such as cigarettes,  e-cigarettes, and chewing tobacco. If you need help quitting, ask your health care provider.  Do not use drugs.  If you are sexually active, practice safe sex. Use a condom or other form of protection to prevent STIs (sexually transmitted infections).  If told by your health care provider, take low-dose aspirin daily starting at age 33.  Find healthy ways to cope with stress, such as: ? Meditation, yoga, or listening to music. ? Journaling. ? Talking to a trusted person. ? Spending time with friends and family. Safety  Always wear your seat belt while driving or riding in a vehicle.  Do not drive: ? If you have been drinking alcohol. Do not ride with someone who has been drinking. ? When you are tired or distracted. ? While texting.  Wear a helmet and other protective equipment during sports activities.  If you have firearms in your house, make sure you follow all gun safety procedures. What's next?  Go to your health care provider once a year for an annual wellness visit.  Ask your health care provider how often you should have your eyes and teeth checked.  Stay up to date on all vaccines. This information is not intended to replace advice given to you by your health care provider. Make sure you discuss any questions you have with your health care provider. Document Revised: 09/15/2019 Document Reviewed: 12/11/2018 Elsevier Patient Education  2021 [employer].

## 2021-02-10 LAB — HEMOGLOBIN A1C
Hgb A1c MFr Bld: 5.8 % of total Hgb — ABNORMAL HIGH (ref <5.7)
Mean Plasma Glucose: 120 mg/dL
eAG (mmol/L): 6.6 mmol/L

## 2021-02-10 LAB — CBC WITH DIFFERENTIAL/PLATELET
Absolute Monocytes: 612 cells/uL (ref 200–950)
Basophils Absolute: 61 cells/uL (ref 0–200)
Basophils Relative: 0.9 %
Eosinophils Absolute: 82 cells/uL (ref 15–500)
Eosinophils Relative: 1.2 %
HCT: 44 % (ref 38.5–50.0)
Hemoglobin: 15 g/dL (ref 13.2–17.1)
Lymphs Abs: 2244 cells/uL (ref 850–3900)
MCH: 30.2 pg (ref 27.0–33.0)
MCHC: 34.1 g/dL (ref 32.0–36.0)
MCV: 88.5 fL (ref 80.0–100.0)
MPV: 11.4 fL (ref 7.5–12.5)
Monocytes Relative: 9 %
Neutro Abs: 3801 cells/uL (ref 1500–7800)
Neutrophils Relative %: 55.9 %
Platelets: 223 10*3/uL (ref 140–400)
RBC: 4.97 10*6/uL (ref 4.20–5.80)
RDW: 12.8 % (ref 11.0–15.0)
Total Lymphocyte: 33 %
WBC: 6.8 10*3/uL (ref 3.8–10.8)

## 2021-02-10 LAB — COMPLETE METABOLIC PANEL WITH GFR
AG Ratio: 1.6 (calc) (ref 1.0–2.5)
ALT: 56 U/L — ABNORMAL HIGH (ref 9–46)
AST: 26 U/L (ref 10–40)
Albumin: 4.5 g/dL (ref 3.6–5.1)
Alkaline phosphatase (APISO): 72 U/L (ref 36–130)
BUN: 11 mg/dL (ref 7–25)
CO2: 29 mmol/L (ref 20–32)
Calcium: 9.2 mg/dL (ref 8.6–10.3)
Chloride: 103 mmol/L (ref 98–110)
Creat: 0.83 mg/dL (ref 0.60–1.35)
GFR, Est African American: 123 mL/min/{1.73_m2} (ref >=60)
GFR, Est Non African American: 106 mL/min/{1.73_m2} (ref >=60)
Globulin: 2.9 g/dL (calc) (ref 1.9–3.7)
Glucose, Bld: 81 mg/dL (ref 65–99)
Potassium: 3.9 mmol/L (ref 3.5–5.3)
Sodium: 140 mmol/L (ref 135–146)
Total Bilirubin: 0.4 mg/dL (ref 0.2–1.2)
Total Protein: 7.4 g/dL (ref 6.1–8.1)

## 2021-02-10 LAB — LIPID PANEL
Cholesterol: 234 mg/dL — ABNORMAL HIGH (ref <200)
HDL: 40 mg/dL (ref >=40)
LDL Cholesterol (Calc): 157 mg/dL (calc) — ABNORMAL HIGH
Non-HDL Cholesterol (Calc): 194 mg/dL (calc) — ABNORMAL HIGH (ref <130)
Total CHOL/HDL Ratio: 5.9 (calc) — ABNORMAL HIGH (ref <5.0)
Triglycerides: 205 mg/dL — ABNORMAL HIGH (ref <150)

## 2021-02-10 LAB — HEPATITIS C ANTIBODY
Hepatitis C Ab: NONREACTIVE
SIGNAL TO CUT-OFF: 0.01 (ref <1.00)

## 2021-02-10 LAB — PSA: PSA: 0.4 ng/mL (ref <=4.0)

## 2021-03-02 LAB — COLOGUARD: Cologuard: NEGATIVE

## 2021-03-15 ENCOUNTER — Ambulatory Visit: Payer: Commercial Managed Care - PPO | Admitting: Dermatology

## 2021-07-26 ENCOUNTER — Encounter: Payer: Self-pay | Admitting: Dermatology

## 2021-07-26 ENCOUNTER — Other Ambulatory Visit: Payer: Self-pay

## 2021-07-26 ENCOUNTER — Ambulatory Visit: Payer: Commercial Managed Care - PPO | Admitting: Dermatology

## 2021-07-26 DIAGNOSIS — L814 Other melanin hyperpigmentation: Secondary | ICD-10-CM

## 2021-07-26 DIAGNOSIS — L219 Seborrheic dermatitis, unspecified: Secondary | ICD-10-CM

## 2021-07-26 DIAGNOSIS — Z86018 Personal history of other benign neoplasm: Secondary | ICD-10-CM

## 2021-07-26 DIAGNOSIS — D1800 Hemangioma unspecified site: Secondary | ICD-10-CM

## 2021-07-26 DIAGNOSIS — Z1283 Encounter for screening for malignant neoplasm of skin: Secondary | ICD-10-CM | POA: Diagnosis not present

## 2021-07-26 DIAGNOSIS — L821 Other seborrheic keratosis: Secondary | ICD-10-CM

## 2021-07-26 DIAGNOSIS — L918 Other hypertrophic disorders of the skin: Secondary | ICD-10-CM

## 2021-07-26 DIAGNOSIS — D229 Melanocytic nevi, unspecified: Secondary | ICD-10-CM

## 2021-07-26 DIAGNOSIS — L578 Other skin changes due to chronic exposure to nonionizing radiation: Secondary | ICD-10-CM

## 2021-07-26 DIAGNOSIS — L820 Inflamed seborrheic keratosis: Secondary | ICD-10-CM

## 2021-07-26 MED ORDER — KETOCONAZOLE 2 % EX CREA: 1.0000 "application " | TOPICAL_CREAM | CUTANEOUS | 4 refills | Status: AC

## 2021-07-26 MED ORDER — HYDROCORTISONE 2.5 % EX LOTN: TOPICAL_LOTION | CUTANEOUS | 6 refills | Status: DC

## 2021-07-26 NOTE — Patient Instructions (Addendum)
If you have any questions or concerns for your doctor, please call our main line at [phone] and press option 4 to reach your doctor's medical assistant. If no one answers, please leave a voicemail as directed and we will return your call as soon as possible. Messages left after 4 pm will be answered the following business day.   You may also send [place] a message via [place]. We typically respond to MyChart messages within 1-2 business days.  For prescription refills, please ask your pharmacy to contact our office. Our fax number is [phone].  If you have an urgent issue when the clinic is closed that cannot wait until the next business day, you can page your doctor at the number below.    Please note that while we do our best to be available for urgent issues outside of office hours, we are not available 24/7.   If you have an urgent issue and are unable to reach [place], you may choose to seek medical care at your doctor's office, retail clinic, urgent care center, or emergency room.  If you have a medical emergency, please immediately call 911 or go to the emergency department.  Pager Numbers  - Dr. Nehemiah Massed: [phone]  - Dr. Laurence Ferrari: [phone]  - Dr. Nicole Kindred: [phone]  In the event of inclement weather, please call our main line at [phone] for an update on the status of any delays or closures.  Dermatology Medication Tips: Please keep the boxes that topical medications come in in order to help keep track of the instructions about where and how to use these. Pharmacies typically print the medication instructions only on the boxes and not directly on the medication tubes.   If your medication is too expensive, please contact our office at [phone] option 4 or send [place] a message through [place].   We are unable to tell what your co-pay for medications will be in advance as this is different depending on your insurance coverage. However, we may be able to find a substitute  medication at lower cost or fill out paperwork to get insurance to cover a needed medication.   If a prior authorization is required to get your medication covered by your insurance company, please allow [place] 1-2 business days to complete this process.  Drug prices often vary depending on where the prescription is filled and some pharmacies may offer cheaper prices.  The website www.goodrx.com contains coupons for medications through different pharmacies. The prices here do not account for what the cost may be with help from insurance (it may be cheaper with your insurance), but the website can give you the price if you did not use any insurance.  - You can print the associated coupon and take it with your prescription to the pharmacy.  - You may also stop by our office during regular business hours and pick up a GoodRx coupon card.  - If you need your prescription sent electronically to a different pharmacy, notify our office through [facility] or by phone at [phone] option 4.   Seborrheic Dermatitis  What is seborrheic dermatitis? Seborrheic (say: seb-oh-ree-ick) dermatitis is a disease that causes flaking of the skin.  It usually affects the scalp.  In teenagers and adults, it is commonly called "dandruff".  In infants, it is referred to as "cradle cap".  Dandruff often appears as scaling on the scalp with or without redness.  On other parts of the body, seborrheic dermatitis tends to produce both redness and  scaling.  Other common locations of seborrheic dermatitis include the central face, eyebrows, chest, and the creases of the arms, legs, and groin.  It often causes the skin to look a little greasy, scaly, or flaky. Seborrheic dermatitis can occur at any age.  It often comes and goes and may to be seasonally related, especially in the Northern climates.  What causes seborrheic dermatitis? The exact cause is not known, though yeast of the Malassezia species may be involved.  This  organism is normally present on the skin in small numbers, but sometimes its numbers increase, especially in oily skin.  Treatments that reduce the yeast tend to improve seborrheic dermatitis.  How is seborrheic dermatitis treated? The treatment of seborrheic dermatitis depends on its location on the body and the person's age. Seborrheic dermatitis of the scalp (dandruff) in adults and teenagers is usually treated with a medicated shampoo.  Here is a list of the medications that help, and the over-counter shampoos that contain them: Salicylic acid (Neutrogena T/Sal, Sebulex, Scalpicin, Denorex Extra Strength) Zinc pyrithione (Head & Shoulders white bottle, Denorex Daily, DHS Zinc, Pantene Pro-V Pyrithione Zinc) Selenium sulfide (Head & Shoulders blue bottle, Selsun Blue, Exsel Lotion Shampoo, Glo-Sel) Yahoo tar (Neutrogena T/Gal, Pentrax, Zetar, Tegrin, Viacom, Therapeutic Denorex) Ketoconazole (Nizoral)  If you have dandruff, you might start by using one of these shampoos every day until your dandruff is controlled and then keep using it at least twice a week.  Often times your doctor will recommend a rotation of several different medicated shampoos as some will experience a plateau in the effectiveness of any one shampoo.   When you use a dandruff shampoo, rub the shampoo into your wet hair and massage into scalp thoroughly.  Let it stay on your hair and scalp for 5 minutes before rinsing.  If you have involvement in the eyebrows or face, you can lather those areas with the medicated shampoo as well, or use a medicated soap (ZNP-bar, Polytar Soap, SAStid, or sulfur soap).    If the wash or shampoo alone does not help, your doctor might want you to use a prescription medication once or twice a day.  Leave-in medications for the scalp are best applied by massaging into the scalp immediately after towel drying your hair, but may be applied even if you have not washed your hair.  Seborrheic dermatitis  in infants usually clears up by age 26 -49 months.  It may develop in the diaper area where it might be confused with diaper rash.  For milder cases you can try gently brushing out scales with a soft brush.  This is best done immediately after washing with a non-medicated baby shampoo Wynetta Emery and Royce Macadamia, etc.).  Your doctor may recommend a medicated shampoo or a prescription topical medication.

## 2021-07-26 NOTE — Progress Notes (Signed)
Follow-Up Visit   Subjective  Martin Parks is a 46 y.o. male who presents for the following: Total body skin exam (Hx of Dysplastic nevi back x 2, excised at Dr. Jenetta Downer), check moles back, and dry scaly skin (Chin, 2-79m itchy, facial lotion). The patient presents for Total-Body Skin Exam (TBSE) for skin cancer screening and mole check.  The following portions of the chart were reviewed this encounter and updated as appropriate:   Tobacco  Allergies  Meds  Problems  Med Hx  Surg Hx  Fam Hx     Review of Systems:  No other skin or systemic complaints except as noted in HPI or Assessment and Plan.  Objective  Well appearing patient in no apparent distress; mood and affect are within normal limits.  A full examination was performed including scalp, head, eyes, ears, nose, lips, neck, chest, axillae, abdomen, back, buttocks, bilateral upper extremities, bilateral lower extremities, hands, feet, fingers, toes, fingernails, and toenails. All findings within normal limits unless otherwise noted below.  back x 2 Scars with no evidence of recurrence.   Left Lower Back x 1, neck x 3, Total x 4 (4) Erythematous keratotic or waxy stuck-on papule or plaque.   face Pink patches with greasy scale.    Assessment & Plan   Lentigines - Scattered tan macules - Due to sun exposure - Benign-appering, observe - Recommend daily broad spectrum sunscreen SPF 30+ to sun-exposed areas, reapply every 2 hours as needed. - Call for any changes  Seborrheic Keratoses - Stuck-on, waxy, tan-brown papules and/or plaques  - Benign-appearing - Discussed benign etiology and prognosis. - Observe - Call for any changes  Melanocytic Nevi - Tan-brown and/or pink-flesh-colored symmetric macules and papules - Benign appearing on exam today - Observation - Call clinic for new or changing moles - Recommend daily use of broad spectrum spf 30+ sunscreen to sun-exposed areas.   Hemangiomas - Red  papules - Discussed benign nature - Observe - Call for any changes  Actinic Damage - Chronic condition, secondary to cumulative UV/sun exposure - diffuse scaly erythematous macules with underlying dyspigmentation - Recommend daily broad spectrum sunscreen SPF 30+ to sun-exposed areas, reapply every 2 hours as needed.  - Staying in the shade or wearing long sleeves, sun glasses (UVA+UVB protection) and wide brim hats (4-inch brim around the entire circumference of the hat) are also recommended for sun protection.  - Call for new or changing lesions.  Skin cancer screening performed today.  History of dysplastic nevus back x 2  Clear. Observe for recurrence. Call clinic for new or changing lesions.  Recommend regular skin exams, daily broad-spectrum spf 30+ sunscreen use, and photoprotection.    Txted at Dr. GElveria Risingoffice, will request pathology again(originally sent request 03/07/20)  Inflamed seborrheic keratosis Left Lower Back x 1, neck x 3, Total x 4  Destruction of lesion - Left Lower Back x 1, neck x 3, Total x 4 Complexity: simple   Destruction method: cryotherapy   Informed consent: discussed and consent obtained   Timeout:  patient name, date of birth, surgical site, and procedure verified Lesion destroyed using liquid nitrogen: Yes   Region frozen until ice ball extended beyond lesion: Yes   Outcome: patient tolerated procedure well with no complications   Post-procedure details: wound care instructions given    Seborrheic dermatitis face  Seborrheic Dermatitis  -  is a chronic persistent rash characterized by pinkness and scaling most commonly of the mid face but also can occur  on the scalp (dandruff), ears; mid chest and mid back. It tends to be exacerbated by stress and cooler weather.  People who have neurologic disease may experience new onset or exacerbation of existing seborrheic dermatitis.  The condition is not curable but treatable and can be  controlled.  Start Ketoconazole 2% cr 3x/wk hs, Monday, Wednesday, and Fridays Start HC 2.5% lotion 3x/wk hs, Tuesday, Thursday, Saturday  Topical steroids (such as triamcinolone, fluocinolone, fluocinonide, mometasone, clobetasol, halobetasol, betamethasone, hydrocortisone) can cause thinning and lightening of the skin if they are used for too long in the same area. Your physician has selected the right strength medicine for your problem and area affected on the body. Please use your medication only as directed by your physician to prevent side effects.    ketoconazole (NIZORAL) 2 % cream - face Apply 1 application topically 3 (three) times a week. Apply to rash on chin 3 nights a week, Monday, Wednesday, and Fridays  hydrocortisone 2.5 % lotion - face Apply topically 3 (three) times a week. Apply to rash on chin 3 nights weekly, Tuesday, Thursday and Saturday  Skin cancer screening  Acrochordons (Skin Tags) - Fleshy, skin-colored pedunculated papules - Benign appearing.  - Observe. - If desired, they can be removed with an in office procedure that is not covered by insurance. - Please call the clinic if you notice any new or changing lesions.   Return in about 1 year (around 07/26/2022) for TBSE, Hx of Dysplastic nevi.  I, Othelia Pulling, RMA, am acting as scribe for Sarina Ser, MD . Documentation: I have reviewed the above documentation for accuracy and completeness, and I agree with the above.  Sarina Ser, MD

## 2021-07-27 ENCOUNTER — Encounter: Payer: Self-pay | Admitting: Dermatology

## 2022-02-13 ENCOUNTER — Encounter: Payer: Commercial Managed Care - PPO | Admitting: Family Medicine

## 2022-02-14 ENCOUNTER — Other Ambulatory Visit: Payer: Self-pay

## 2022-02-14 ENCOUNTER — Encounter: Payer: Self-pay | Admitting: Physician Assistant

## 2022-02-14 ENCOUNTER — Ambulatory Visit (INDEPENDENT_AMBULATORY_CARE_PROVIDER_SITE_OTHER): Payer: Commercial Managed Care - PPO | Admitting: Physician Assistant

## 2022-02-14 VITALS — BP 104/68 | HR 89 | Temp 98.5°F | Resp 16 | Ht 71.0 in | Wt 259.6 lb

## 2022-02-14 DIAGNOSIS — Z0000 Encounter for general adult medical examination without abnormal findings: Secondary | ICD-10-CM

## 2022-02-14 DIAGNOSIS — Z6835 Body mass index (BMI) 35.0-35.9, adult: Secondary | ICD-10-CM

## 2022-02-14 DIAGNOSIS — Z1211 Encounter for screening for malignant neoplasm of colon: Secondary | ICD-10-CM | POA: Diagnosis not present

## 2022-02-14 MED ORDER — NA SULFATE-K SULFATE-MG SULF 17.5-3.13-1.6 GM/177ML PO SOLN: 1.0000 | Freq: Once | ORAL | 0 refills | Status: AC

## 2022-02-14 NOTE — Progress Notes (Signed)
Name: Martin Parks   MRN: [id number]    DOB: 1975/09/07   Date:02/14/2022       Progress Note  Today's Provider: Talitha Givens, MHS, PA-C Introduced myself to the patient as a PA-C and provided education on APPs in clinical practice.    Subjective  Chief Complaint  Chief Complaint  Patient presents with   Annual Exam    HPI  Patient presents for annual CPE today  He is managed for OSA, prediabetes, anxiety, hyperlipidemia, and obesity. He sees Optometrist yearly   Acute concern: pain under right breast with palpation. Reports tenderness to light palpation Denies concern for mass, discharge from nipple or skin changes to area.    IPSS Questionnaire (AUA-7):  Over the past month   1)  How often have you had a sensation of not emptying your bladder completely after you finish urinating?  1 - Less than 1 time in 5  2)  How often have you had to urinate again less than two hours after you finished urinating? 0 - Not at all  3)  How often have you found you stopped and started again several times when you urinated?  0 - Not at all  4) How difficult have you found it to postpone urination?  0 - Not at all  5) How often have you had a weak urinary stream?  0 - Not at all  6) How often have you had to push or strain to begin urination?  0 - Not at all  7) How many times did you most typically get up to urinate from the time you went to bed until the time you got up in the morning?  2 - 2 times  Total score:  0-7 mildly symptomatic   8-19 moderately symptomatic   20-35 severely symptomatic     Diet: States since last year he has tried to cut out sodas. Reports he is trying to reduce gluten and sugar. Reports overall varied diet with fruits, vegetables, meats. Is trying to focus on portion control  Exercise: Not as much as he'd like. States he recently had a cold. Reports he prefers to have an active lifestyle rather than regimented gym-type exercise Sleep:States it is much  better after using CPAP. Sleeps through the night unless he has to get up to use restroom. Getting about 8 hours of sleep. Feels overall rested when he wakes.  Mood: Reports it is "not great" due to work demands. Trying to focus on family life and things that make him happy. He is seeing psychologist   Depression: phq 9 is negative Depression screen [facility] 2/9 02/14/2022 02/09/2021 02/09/2020 02/06/2019 12/22/2018  Decreased Interest 0 0 0 0 0  Down, Depressed, Hopeless 0 0 0 0 1  PHQ - 2 Score 0 0 0 0 1  Altered sleeping 0 0 0 0 2  Tired, decreased energy 0 0 0 0 2  Change in appetite 0 0 0 0 1  Feeling bad or failure about yourself  0 0 0 0 0  Trouble concentrating 0 0 0 0 0  Moving slowly or fidgety/restless 0 0 0 0 0  Suicidal thoughts 0 0 0 0 0  PHQ-9 Score 0 0 0 0 6  Difficult doing work/chores Not difficult at all - Not difficult at all Not difficult at all Somewhat difficult    Hypertension:  BP Readings from Last 3 Encounters:  02/14/22 104/68  02/09/21 130/78  02/09/20 132/84    Obesity:  Wt Readings from Last 3 Encounters:  02/14/22 259 lb 9.6 oz (117.8 kg)  02/09/21 253 lb (114.8 kg)  02/09/20 241 lb 4.8 oz (109.5 kg)   BMI Readings from Last 3 Encounters:  02/14/22 36.21 kg/m  02/09/21 35.29 kg/m  02/09/20 33.65 kg/m     Lipids:  Lab Results  Component Value Date   CHOL 234 (H) 02/09/2021   CHOL 251 (H) 02/09/2020   CHOL 222 (H) 12/22/2018   Lab Results  Component Value Date   HDL 40 02/09/2021   HDL 45 02/09/2020   HDL 42 12/22/2018   Lab Results  Component Value Date   LDLCALC 157 (H) 02/09/2021   LDLCALC 171 (H) 02/09/2020   LDLCALC 144 (H) 12/22/2018   Lab Results  Component Value Date   TRIG 205 (H) 02/09/2021   TRIG 195 (H) 02/09/2020   TRIG 222 (H) 12/22/2018   Lab Results  Component Value Date   CHOLHDL 5.9 (H) 02/09/2021   CHOLHDL 5.6 (H) 02/09/2020   CHOLHDL 5.3 (H) 12/22/2018   No results found for: LDLDIRECT Glucose:  Glucose   Date Value Ref Range Status  01/27/2013 128 (H) 65 - 99 mg/dL Final   Glucose, Bld  Date Value Ref Range Status  02/09/2021 81 65 - 99 mg/dL Final    Comment:    .            Fasting reference interval .   02/09/2020 85 65 - 99 mg/dL Final    Comment:    .            Fasting reference interval .   12/22/2018 81 65 - 139 mg/dL Final    Comment:    .        Non-fasting reference interval .     [place] Office Visit from 02/09/2020 in [facility]  AUDIT-C Score 1        Married STD testing and prevention (HIV/chl/gon/syphilis):  not applicable Hep C Screening: completed  Skin cancer: Discussed monitoring for atypical lesions Colorectal cancer: Agrees to colonoscopy instead of cologuard Prostate cancer:  Screening today with PSA  Lab Results  Component Value Date   PSA 0.40 02/09/2021   PSA 0.4 09/25/2018     Lung cancer:  Low Dose CT Chest recommended if Age 64-80 years, 30 pack-year currently smoking OR have quit w/in 15years. Patient  not applicable AAA: The USPSTF recommends one-time screening with ultrasonography in men ages 44 to 48 years who have ever smoked. Patient:  not applicable ECG:  not required today  Vaccines:   HPV: aged out Tdap: due next year Shingrix: not due yet  Pneumonia: not due  Flu: not due  COVID-19: not accepting vaccine   Advanced Care Planning: A voluntary discussion about advance care planning including the explanation and discussion of advance directives.  Discussed health care proxy and Living will, and the patient was able to identify a health care proxy as his wife Willette Brace.  Patient  has not completed but we discussed the advanced care planning portion of MyChart.     Patient Active Problem List   Diagnosis Date Noted   Class 2 severe obesity with serious comorbidity and body mass index (BMI) of 35.0 to 35.9 in adult [facility]) 02/09/2021   OSA on CPAP 06/05/2020   Prediabetes 02/10/2020    Hyperactivity 12/22/2018   Generalized anxiety disorder 12/22/2018   Hyperlipidemia LDL goal <100 12/22/2018    Past Surgical History:  Procedure Laterality Date   HERNIA REPAIR     inguinal   TONSILLECTOMY     UMBILICAL HERNIA REPAIR N/A 03/02/2019   Procedure: OPEN HERNIA REPAIR UMBILICAL ADULT WITH MESH;  Surgeon: Vickie Epley, MD;  Location: [facility] ORS;  Service: General;  Laterality: N/A;    Family History  Problem Relation Age of Onset   Cancer Sister        breast    Social History   Socioeconomic History   Marital status: Married    Spouse name: Willette Brace   Number of children: 2   Years of education: 12   Highest education level: Bachelor's degree (e.g., BA, AB, BS)  Occupational History   Not on file  Tobacco Use   Smoking status: Never   Smokeless tobacco: Never  Vaping Use   Vaping Use: Never used  Substance and Sexual Activity   Alcohol use: Yes    Alcohol/week: 0.0 standard drinks    Comment: rare   Drug use: No   Sexual activity: Yes    Partners: Female  Other Topics Concern   Not on file  Social History Narrative   Not on file   Social Determinants of Health   Financial Resource Strain: Unknown   Difficulty of Paying Living Expenses: Patient refused  Food Insecurity: Unknown   Worried About Charity fundraiser in the Last Year: Patient refused   Arboriculturist in the Last Year: Patient refused  Transportation Needs: Unknown   Film/video editor (Medical): Patient refused   Lack of Transportation (Non-Medical): Patient refused  Physical Activity: Insufficiently Active   Days of Exercise per Week: 2 days   Minutes of Exercise per Session: 30 min  Stress: Unknown   Feeling of Stress : Patient refused  Social Connections: Moderately Isolated   Frequency of Communication with Friends and Family: Twice a week   Frequency of Social Gatherings with Friends and Family: Twice a week   Attends Religious Services: Never   Corporate treasurer or Organizations: No   Attends Music therapist: Never   Marital Status: Married  Human resources officer Violence: Not At Risk   Fear of Current or Ex-Partner: No   Emotionally Abused: No   Physically Abused: No   Sexually Abused: No     Current Outpatient Medications:    ALPRAZolam (XANAX) 0.5 MG tablet, Take 0.5 mg by mouth 2 (two) times daily as needed for anxiety. , Disp: , Rfl: 5   b complex vitamins capsule, Take 1 capsule by mouth daily., Disp: , Rfl:    cholecalciferol (VITAMIN D3) 25 MCG (1000 UNIT) tablet, Take 5,000 Units by mouth daily., Disp: , Rfl:    desvenlafaxine (PRISTIQ) 100 MG 24 hr tablet, Take 100 mg by mouth every morning. , Disp: , Rfl:    fluticasone (FLONASE) 50 MCG/ACT nasal spray, SHAKE WELL AND USE 2 SPRAYS IN EACH NOSTRIL DAILY (Patient taking differently: Place 2 sprays into both nostrils daily.), Disp: 16 g, Rfl: 12   hydrocortisone 2.5 % lotion, Apply topically 3 (three) times a week. Apply to rash on chin 3 nights weekly, Tuesday, Thursday and Saturday, Disp: 59 mL, Rfl: 6   loratadine (CLARITIN) 10 MG tablet, Take by mouth., Disp: , Rfl:    Misc Natural Products (OSTEO BI-FLEX ADV TRIPLE ST) TABS, Take 1 tablet by mouth daily., Disp: , Rfl:    Omega-3 Fatty Acids (FISH OIL) 1000 MG CAPS, Take 1,000 mg by mouth daily. ,  Disp: , Rfl:    Turmeric 500 MG CAPS, Take 500 mg by mouth daily. , Disp: , Rfl:   Allergies  Allergen Reactions   Adhesive [Tape] Rash     Review of Systems  Constitutional: Negative.  Negative for chills, diaphoresis, fever and malaise/fatigue.  HENT:  Positive for hearing loss (Chronic and followed by ENT). Negative for congestion, sinus pain, sore throat and tinnitus.   Eyes: Negative.   Respiratory:  Negative for cough, shortness of breath, wheezing and stridor.   Cardiovascular: Negative.  Negative for chest pain, palpitations, orthopnea, leg swelling and PND.  Gastrointestinal: Negative.  Negative for  constipation, diarrhea, heartburn, nausea and vomiting.  Genitourinary: Negative.  Negative for frequency and urgency.  Musculoskeletal: Negative.   Skin:  Negative for itching and rash.  Neurological:  Positive for headaches. Negative for dizziness, tingling, tremors, sensory change, speech change, focal weakness, seizures, loss of consciousness and weakness.  Psychiatric/Behavioral:  Negative for depression, hallucinations, memory loss, substance abuse and suicidal ideas. The patient is nervous/anxious. The patient does not have insomnia.       Objective  Vitals:   02/14/22 0807  BP: 104/68  Pulse: 89  Resp: 16  Temp: 98.5 F (36.9 C)  TempSrc: Oral  SpO2: 94%  Weight: 259 lb 9.6 oz (117.8 kg)  Height: 5' 11"  (1.803 m)    Body mass index is 36.21 kg/m.  Physical Exam Vitals reviewed.  Constitutional:      General: He is awake.     Appearance: Normal appearance. He is well-developed and well-groomed. He is obese.  HENT:     Head: Normocephalic and atraumatic.     Right Ear: Hearing, tympanic membrane, ear canal and external ear normal.     Left Ear: Hearing, tympanic membrane, ear canal and external ear normal.     Nose: Nose normal.     Mouth/Throat:     Lips: Pink.     Mouth: Mucous membranes are moist.     Pharynx: Oropharynx is clear. Uvula midline.  Eyes:     General: Lids are normal.     Extraocular Movements: Extraocular movements intact.     Conjunctiva/sclera: Conjunctivae normal.     Pupils: Pupils are equal, round, and reactive to light.  Neck:     Thyroid: No thyroid mass, thyromegaly or thyroid tenderness.     Trachea: Trachea and phonation normal.  Cardiovascular:     Rate and Rhythm: Normal rate and regular rhythm.     Pulses: Normal pulses.     Heart sounds: Normal heart sounds.  Pulmonary:     Effort: Pulmonary effort is normal.     Breath sounds: Normal breath sounds. No decreased breath sounds, wheezing, rhonchi or rales.  Chest:     Chest  wall: Tenderness present. No mass, deformity, crepitus or edema.    Abdominal:     General: Abdomen is protuberant. Bowel sounds are normal.     Palpations: Abdomen is soft. There is no hepatomegaly.     Tenderness: There is no abdominal tenderness.  Musculoskeletal:     Cervical back: Normal range of motion and neck supple.     Right lower leg: No edema.     Left lower leg: No edema.  Lymphadenopathy:     Head:     Right side of head: No submental or submandibular adenopathy.     Left side of head: No submental or submandibular adenopathy.     Upper Body:     Right  upper body: No supraclavicular adenopathy.     Left upper body: No supraclavicular adenopathy.  Neurological:     General: No focal deficit present.     Mental Status: He is alert and oriented to person, place, and time.     Motor: Motor function is intact. No weakness.     Deep Tendon Reflexes:     Reflex Scores:      Tricep reflexes are 2+ on the right side and 2+ on the left side.      Patellar reflexes are 2+ on the right side and 2+ on the left side. Psychiatric:        Attention and Perception: Attention and perception normal.        Mood and Affect: Mood and affect normal.        Speech: Speech normal.        Behavior: Behavior normal. Behavior is cooperative.        Thought Content: Thought content normal.        Cognition and Memory: Cognition normal.        Judgment: Judgment normal.     No results found for this or any previous visit (from the past 2160 hour(s)).   Fall Risk: Fall Risk  02/14/2022 02/09/2021 02/09/2020 03/17/2019 03/17/2019  Falls in the past year? 0 0 0 0 0  Number falls in past yr: 0 0 0 0 -  Injury with Fall? 0 0 0 0 -  Risk for fall due to : No Fall Risks - - - -  Follow up Falls prevention discussed - - - -     Functional Status Survey: Is the patient deaf or have difficulty hearing?: No Does the patient have difficulty seeing, even when wearing glasses/contacts?: No Does the  patient have difficulty concentrating, remembering, or making decisions?: No Does the patient have difficulty walking or climbing stairs?: No Does the patient have difficulty dressing or bathing?: No Does the patient have difficulty doing errands alone such as visiting a doctor's office or shopping?: No    Assessment & Plan    Problem List Items Addressed This Visit       Other   Class 2 severe obesity with serious comorbidity and body mass index (BMI) of 35.0 to 35.9 in adult [facility])    Discussed diet and exercise  Recommend maintaining active lifestyle and monitoring portions to help reduce further weight gain      Other Visit Diagnoses     Encounter for annual physical exam    -  Primary -Prostate cancer screening and PSA options (with potential risks and benefits of testing vs not testing) were discussed along with recent recs/guidelines. -USPSTF grade A and B recommendations reviewed with patient; age-appropriate recommendations, preventive care, screening tests, etc discussed and encouraged; healthy living encouraged; see AVS for patient education given to patient -Discussed importance of 150 minutes of physical activity weekly, eat two servings of fish weekly, eat one serving of tree nuts ( cashews, pistachios, pecans, almonds.Marland Kitchen) every other day, eat 6 servings of fruit/vegetables daily and drink plenty of water and avoid sweet beverages.  -Reviewed Health Maintenance: yes    Blood tests for routine general physical examination       Relevant Orders   Comprehensive Metabolic Panel (CMET)   Lipid Profile   CBC w/Diff/Platelet   PSA   HgB A1c   Screening for colon cancer       Relevant Orders   Ambulatory referral to Gastroenterology  Return in about 6 months (around 08/14/2022) for chronic disease management.   I, Ebony Rickel E Camran Keady, PA-C, have reviewed all documentation for this visit. The documentation on 02/14/22 for the exam, diagnosis, procedures, and orders are  all accurate and complete.   Crewe Heathman, [place]ie [place] MPH [facility]

## 2022-02-14 NOTE — Patient Instructions (Signed)
It was nice to meet you and I appreciate the opportunity to be involved in your care  Continue to stay active as much as possible throughout the day I have included information to help with overall health maintenance  Please let us know if you have any concerns for your mood so we can address Someone from the referral team should reach out to to help schedule your colonoscopy.

## 2022-02-14 NOTE — Progress Notes (Signed)
Gastroenterology Pre-Procedure Review  Request Date: 03/30/2022 Requesting Physician: Dr. Vicente Males  PATIENT REVIEW QUESTIONS: The patient responded to the following health history questions as indicated:    1. Are you having any GI issues? no 2. Do you have a personal history of Polyps? no 3. Do you have a family history of Colon Cancer or Polyps? no 4. Diabetes Mellitus? no 5. Joint replacements in the past 12 months?no 6. Major health problems in the past 3 months?no 7. Any artificial heart valves, MVP, or defibrillator?no    MEDICATIONS & ALLERGIES:    Patient reports the following regarding taking any anticoagulation/antiplatelet therapy:   Plavix, Coumadin, Eliquis, Xarelto, Lovenox, Pradaxa, Brilinta, or Effient? no Aspirin? no  Patient confirms/reports the following medications:  Current Outpatient Medications  Medication Sig Dispense Refill   ALPRAZolam (XANAX) 0.5 MG tablet Take 0.5 mg by mouth 2 (two) times daily as needed for anxiety.   5   b complex vitamins capsule Take 1 capsule by mouth daily.     cholecalciferol (VITAMIN D3) 25 MCG (1000 UNIT) tablet Take 5,000 Units by mouth daily.     desvenlafaxine (PRISTIQ) 100 MG 24 hr tablet Take 100 mg by mouth every morning.      fluticasone (FLONASE) 50 MCG/ACT nasal spray SHAKE WELL AND USE 2 SPRAYS IN EACH NOSTRIL DAILY (Patient taking differently: Place 2 sprays into both nostrils daily.) 16 g 12   hydrocortisone 2.5 % lotion Apply topically 3 (three) times a week. Apply to rash on chin 3 nights weekly, Tuesday, Thursday and Saturday 59 mL 6   loratadine (CLARITIN) 10 MG tablet Take by mouth.     Misc Natural Products (OSTEO BI-FLEX ADV TRIPLE ST) TABS Take 1 tablet by mouth daily.     Omega-3 Fatty Acids (FISH OIL) 1000 MG CAPS Take 1,000 mg by mouth daily.      Turmeric 500 MG CAPS Take 500 mg by mouth daily.      No current facility-administered medications for this visit.    Patient confirms/reports the following  allergies:  Allergies  Allergen Reactions   Grass Extracts [Gramineae Pollens]    Adhesive [Tape] Rash    No orders of the defined types were placed in this encounter.   AUTHORIZATION INFORMATION Primary Insurance: 1D#: Group #:  Secondary Insurance: 1D#: Group #:  SCHEDULE INFORMATION: Date: 03/30/2022  Time: Location: [place]

## 2022-02-14 NOTE — Assessment & Plan Note (Signed)
Discussed diet and exercise  Recommend maintaining active lifestyle and monitoring portions to help reduce further weight gain

## 2022-02-15 LAB — CBC WITH DIFFERENTIAL/PLATELET
Absolute Monocytes: 432 cells/uL (ref 200–950)
Basophils Absolute: 41 cells/uL (ref 0–200)
Basophils Relative: 0.9 %
Eosinophils Absolute: 129 cells/uL (ref 15–500)
Eosinophils Relative: 2.8 %
HCT: 42.2 % (ref 38.5–50.0)
Hemoglobin: 14 g/dL (ref 13.2–17.1)
Lymphs Abs: 1785 cells/uL (ref 850–3900)
MCH: 29.2 pg (ref 27.0–33.0)
MCHC: 33.2 g/dL (ref 32.0–36.0)
MCV: 88.1 fL (ref 80.0–100.0)
MPV: 11.4 fL (ref 7.5–12.5)
Monocytes Relative: 9.4 %
Neutro Abs: 2213 cells/uL (ref 1500–7800)
Neutrophils Relative %: 48.1 %
Platelets: 204 10*3/uL (ref 140–400)
RBC: 4.79 10*6/uL (ref 4.20–5.80)
RDW: 12.5 % (ref 11.0–15.0)
Total Lymphocyte: 38.8 %
WBC: 4.6 10*3/uL (ref 3.8–10.8)

## 2022-02-15 LAB — LIPID PANEL
Cholesterol: 215 mg/dL — ABNORMAL HIGH (ref <200)
HDL: 40 mg/dL (ref >=40)
LDL Cholesterol (Calc): 146 mg/dL (calc) — ABNORMAL HIGH
Non-HDL Cholesterol (Calc): 175 mg/dL (calc) — ABNORMAL HIGH (ref <130)
Total CHOL/HDL Ratio: 5.4 (calc) — ABNORMAL HIGH (ref <5.0)
Triglycerides: 155 mg/dL — ABNORMAL HIGH (ref <150)

## 2022-02-15 LAB — COMPREHENSIVE METABOLIC PANEL
AG Ratio: 1.6 (calc) (ref 1.0–2.5)
ALT: 34 U/L (ref 9–46)
AST: 18 U/L (ref 10–40)
Albumin: 4.4 g/dL (ref 3.6–5.1)
Alkaline phosphatase (APISO): 71 U/L (ref 36–130)
BUN: 13 mg/dL (ref 7–25)
CO2: 30 mmol/L (ref 20–32)
Calcium: 9.1 mg/dL (ref 8.6–10.3)
Chloride: 102 mmol/L (ref 98–110)
Creat: 0.84 mg/dL (ref 0.60–1.29)
Globulin: 2.8 g/dL (calc) (ref 1.9–3.7)
Glucose, Bld: 90 mg/dL (ref 65–99)
Potassium: 4.5 mmol/L (ref 3.5–5.3)
Sodium: 140 mmol/L (ref 135–146)
Total Bilirubin: 0.4 mg/dL (ref 0.2–1.2)
Total Protein: 7.2 g/dL (ref 6.1–8.1)

## 2022-02-15 LAB — HEMOGLOBIN A1C
Hgb A1c MFr Bld: 5.7 % of total Hgb — ABNORMAL HIGH (ref <5.7)
Mean Plasma Glucose: 117 mg/dL
eAG (mmol/L): 6.5 mmol/L

## 2022-02-15 LAB — PSA: PSA: 0.4 ng/mL (ref <=4.00)

## 2022-03-28 ENCOUNTER — Telehealth: Payer: Self-pay

## 2022-03-28 NOTE — Telephone Encounter (Signed)
Copied from [place] [phone]. Topic: Referral - Request for Referral ?>> Mar 28, 2022  8:50 AM Antonieta Iba C wrote: ?Pt called in to update provider. Pt says that in his last visit he spoke with provider Mecum about having right nipple pain. Pt says that he was then advised to update her if pain isn't better. Pt says that he was told that he would receive a referral or need to be seen sooner. Pt is unsure of who/where he need to schedule his appt.  ? ?Pt would like to be advised further. ?

## 2022-03-28 NOTE — Telephone Encounter (Signed)
Copied from [place] [id number]. Topic: Referral - Request for Referral ?>> Mar 28, 2022  8:50 AM Martin Parks wrote: ?Pt called in to update provider. Pt says that in his last visit he spoke with provider Mecum about having right nipple pain. Pt says that he was then advised to update her if pain isn't better. Pt says that he was told that he would receive a referral or need to be seen sooner. Pt is unsure of who/where he need to schedule his appt.  ? ?Pt would like to be advised further. ?

## 2022-03-28 NOTE — Telephone Encounter (Signed)
Pt will need to be seen in office with a different provider since Martin Parks Is no longer here. Please have him schedule. ?

## 2022-03-28 NOTE — Telephone Encounter (Signed)
Pt will need an appointment since [place] is no longer here. Can you please get him scheduled. ?

## 2022-03-29 ENCOUNTER — Encounter: Payer: Self-pay | Admitting: Family Medicine

## 2022-03-29 ENCOUNTER — Telehealth (INDEPENDENT_AMBULATORY_CARE_PROVIDER_SITE_OTHER): Payer: Commercial Managed Care - PPO | Admitting: Family Medicine

## 2022-03-29 DIAGNOSIS — R7989 Other specified abnormal findings of blood chemistry: Secondary | ICD-10-CM

## 2022-03-29 DIAGNOSIS — N62 Hypertrophy of breast: Secondary | ICD-10-CM | POA: Diagnosis not present

## 2022-03-29 DIAGNOSIS — N63 Unspecified lump in unspecified breast: Secondary | ICD-10-CM | POA: Diagnosis not present

## 2022-03-29 DIAGNOSIS — N644 Mastodynia: Secondary | ICD-10-CM

## 2022-03-29 NOTE — Patient Instructions (Signed)
There is low yield with lab testing - so we will start with the testosterone and add on after that if needed.   I have ordered an ultrasound - which will help characterize the tissue and also helps to rule out malignancy.  ?Please contact us in the meantime if anything worsens. ? ?Come to do your initial testosterone between 8 and 9 am any weekday we are open. ? ? ? ?Gynecomastia, Adult ?Gynecomastia is an overgrowth of gland tissue in a man's breasts. This may cause one or both breasts to become enlarged. The condition often develops in men who have an imbalance of the male sex hormone (testosterone) and the male sex hormone (estrogen). This means that a man may have too much estrogen, too little testosterone, or both. Gynecomastia may be a normal part of aging for some men. It can also happen to adolescent boys during puberty. ?What are the causes? ?This condition may be caused by: ?Certain medicines, such as: ?Estrogen supplements and medicines that act like estrogen in the body. ?Medicines that keep testosterone from functioning normally in the body (testosterone-inhibiting drugs). ?Anabolic steroids. ?Medicines to treat heartburn, cancer, heart disease, mental health problems, HIV, or AIDS. ?Antibiotic medicine. ?Chemotherapy medicine. ?Recreational drugs, including alcohol, marijuana, and opioids. ?Herbal products, including lavender and tea tree oil. ?A gene that is passed from parent to child (inherited). ?Certain medical conditions, such as: ?Tumors in the pituitary or adrenal gland. ?An overactive thyroid gland. ?Certain inherited disorders, including a genetic disease that causes low testosterone in males (Klinefelter syndrome). ?Cancer of the lung, kidney, liver, testicle, or gastrointestinal tract. ?Conditions that cause liver or kidney failure. ?Poor nutrition and starvation. ?Testicle shrinking or failure (testicularatrophy). ?In some cases, the cause may not be known. ?What increases the risk? ?The  following factors may make you more likely to develop this condition: ?Being 69 years old or older. ?Being overweight. ?Abusing alcohol or other drugs. ?Having a family history of gynecomastia. ?What are the signs or symptoms? ?In most cases, breast enlargement is the only symptom. The enlargement may start near the nipple, and the breast tissue may feel firm and rubbery. Other symptoms may include: ?Pain or tenderness in the breasts. ?Itchy breasts. ?How is this diagnosed? ?This condition may be diagnosed based on: ?Your symptoms and medical history. ?A physical exam. ?Imaging tests, such as: ?An ultrasound. ?A mammogram. ?An MRI. ?Blood tests. ?Removal of a sample of breast tissue to be tested in a lab (biopsy). ?How is this treated? ?This condition may go away on its own, without treatment. If gynecomastia is caused by a medical problem or drug abuse, treatment may include: ?Getting treatment for the underlying medical problem or for drug abuse. ?Changing or stopping medicines. ?Medicines to block the effects of estrogen. ?Taking a testosterone replacement. ?Surgery to remove breast tissue or any lumps in your breasts. ?Breast reduction surgery. This may be an option if you have severe or painful gynecomastia. ?Follow these instructions at home: ? ?Take over-the-counter and prescription medicines only as told by your health care provider. ?Talk to your health care provider before taking any herbal medicines or diet supplements. ?Do not abuse drugs or alcohol. ?Keep all follow-up visits as told by your health care provider. This is important. ?Contact a health care provider if: ?Your breast tissue grows larger or gets more swollen or painful. ?You have a lump in your testicle. ?You have blood or discharge coming from your nipples. ?Your nipple changes shape. ?You develop a hard or painful  lump in your breast. ?Summary ?Gynecomastia is an overgrowth of gland tissue in a man's breasts. This may cause one or both  breasts to become enlarged. ?In most cases, breast enlargement is the only symptom. The enlargement may start near the nipple, and the breast tissue may feel firm and rubbery. ?This condition may go away on its own, without treatment. In some cases, treatment for an underlying medical problem or for drug abuse may be needed. ?Take over-the-counter and prescription medicines only as told by your health care provider. ?Do not abuse drugs or alcohol. ?This information is not intended to replace advice given to you by your health care provider. Make sure you discuss any questions you have with your health care provider. ?Document Revised: 06/11/2019 Document Reviewed: 06/11/2019 ?Elsevier Patient Education ? [place]. ? ?

## 2022-03-29 NOTE — Progress Notes (Signed)
? ?Name: Martin Parks   MRN: [id number]    DOB: 08/06/1975   Date:03/29/2022 ? ?     Progress Note ? ?Subjective:  ? ? ?Chief Complaint ? ?Chief Complaint  ?Patient presents with  ? Pain  ?  Sharp pain underneath right nipple, onset since Dec-Jan. Stays tender even without touching it and notice some swelling  ? ? ?I connected with  [name]  on 03/29/22 at  9:00 AM EDT by a video enabled telemedicine application and verified that I am speaking with the correct person using two identifiers.  I discussed the limitations of evaluation and management by telemedicine and the availability of in person appointments. The patient expressed understanding and agreed to proceed. Staff also discussed with the patient that there may be a patient responsible charge related to this service. ?Patient Location: home ?Provider Location: home office ?Additional Individuals present:  ? ?HPI ?Painful area right breast tenderness, slight swelling ongoing for 2-3 months  ?Hasn't changed in size, no redness, it does feel like its in soft tissue ?Painful if bumped or touched - for example rolling over in bed, but able to go about his day without much bothersome pain  ? ?Two sisters one with breast CA and one with glioblastoma, no other hx of ca in family ? ?No new supplements or meds pristiq ?On tumeric, vit D/calcium and  ? ? ? ?Patient Active Problem List  ? Diagnosis Date Noted  ? Class 2 severe obesity with serious comorbidity and body mass index (BMI) of 35.0 to 35.9 in adult [facility]) 02/09/2021  ? OSA on CPAP 06/05/2020  ? Prediabetes 02/10/2020  ? Hyperactivity 12/22/2018  ? Generalized anxiety disorder 12/22/2018  ? Hyperlipidemia LDL goal <100 12/22/2018  ? ? ?Social History  ? ?Tobacco Use  ? Smoking status: Never  ? Smokeless tobacco: Never  ?Substance Use Topics  ? Alcohol use: Yes  ?  Alcohol/week: 0.0 standard drinks  ?  Comment: rare  ? ? ? ?Current Outpatient Medications:  ?  ALPRAZolam (XANAX) 0.5 MG tablet, Take  0.5 mg by mouth 2 (two) times daily as needed for anxiety. , Disp: , Rfl: 5 ?  b complex vitamins capsule, Take 1 capsule by mouth daily., Disp: , Rfl:  ?  cholecalciferol (VITAMIN D3) 25 MCG (1000 UNIT) tablet, Take 5,000 Units by mouth daily., Disp: , Rfl:  ?  desvenlafaxine (PRISTIQ) 100 MG 24 hr tablet, Take 100 mg by mouth every morning. , Disp: , Rfl:  ?  fluticasone (FLONASE) 50 MCG/ACT nasal spray, SHAKE WELL AND USE 2 SPRAYS IN EACH NOSTRIL DAILY (Patient taking differently: Place 2 sprays into both nostrils daily.), Disp: 16 g, Rfl: 12 ?  hydrocortisone 2.5 % lotion, Apply topically 3 (three) times a week. Apply to rash on chin 3 nights weekly, Tuesday, Thursday and Saturday, Disp: 59 mL, Rfl: 6 ?  loratadine (CLARITIN) 10 MG tablet, Take by mouth., Disp: , Rfl:  ?  Misc Natural Products (OSTEO BI-FLEX ADV TRIPLE ST) TABS, Take 1 tablet by mouth daily., Disp: , Rfl:  ?  Omega-3 Fatty Acids (FISH OIL) 1000 MG CAPS, Take 1,000 mg by mouth daily. , Disp: , Rfl:  ?  Turmeric 500 MG CAPS, Take 500 mg by mouth daily. , Disp: , Rfl:  ? ?Allergies  ?Allergen Reactions  ? Grass Extracts [Gramineae Pollens]   ? Adhesive [Tape] Rash  ? ? ?I personally reviewed active problem list, medication list, allergies, family history, social history, health maintenance, notes  from last encounter, lab results, imaging with the patient/caregiver today. ? ? ?Review of Systems  ?Constitutional: Negative.  Negative for unexpected weight change.  ?HENT: Negative.    ?Eyes: Negative.   ?Respiratory: Negative.    ?Cardiovascular: Negative.   ?Gastrointestinal: Negative.   ?Endocrine: Negative.   ?Genitourinary: Negative.   ?Musculoskeletal: Negative.   ?Skin: Negative.  Negative for color change, pallor, rash and wound.  ?Allergic/Immunologic: Negative.   ?Neurological: Negative.   ?Hematological: Negative.   ?Psychiatric/Behavioral: Negative.    ?All other systems reviewed and are negative.  ? ? ?Objective:  ? ?Virtual encounter,  vitals limited, only able to obtain the following ?There were no vitals filed for this visit. ?There is no height or weight on file to calculate BMI. ?Nursing Note and Vital Signs reviewed. ? ?Physical Exam ?Vitals and nursing note reviewed.  ?Constitutional:   ?   General: He is not in acute distress. ?   Appearance: He is not ill-appearing, toxic-appearing or diaphoretic.  ?Neurological:  ?   Mental Status: He is alert.  ? ? ?PE limited by virtual encounter ? ?No results found for this or any previous visit (from the past 72 hour(s)). ? ?Assessment and Plan:  ? ?  ICD-10-CM   ?1. Breast pain, right  N64.4 Testosterone  ?  US BREAST LTD UNI RIGHT INC AXILLA  ?  ?2. Breast mass in male  N63.0 Testosterone  ?  US BREAST LTD UNI RIGHT INC AXILLA  ?  ?3. Gynecomastia  N62 Testosterone  ?  ? ?Discussed possible causes of sx, will start with testosterone and US imaging - did explain that if he has low T f/up tests will be needed. Previously examined in person last month with CPE - reviewed documentation of PE, pt states no change, no redness or increase in size - sounds like gynecomastia - reviewed meds/supplements - nothing obviously causing.  ? ?-Red flags and when to present for emergency care or RTC including new/worsening/un-resolving symptoms, reviewed with patient at time of visit. Follow up and care instructions discussed and provided in AVS. ?- I discussed the assessment and treatment plan with the patient. The patient was provided an opportunity to ask questions and all were answered. The patient agreed with the plan and demonstrated an understanding of the instructions. ? ?I provided 23+ minutes of non-face-to-face time during this encounter. ? ?Delsa Grana, PA-C ?03/29/22 9:18 AM ? ? ? ?

## 2022-03-30 ENCOUNTER — Encounter: Payer: Self-pay | Admitting: Gastroenterology

## 2022-03-30 ENCOUNTER — Ambulatory Visit
Admission: RE | Admit: 2022-03-30 | Discharge: 2022-03-30 | Disposition: A | Discharge status: Home / Self Care | Payer: Commercial Managed Care - PPO | Attending: Gastroenterology | Admitting: Gastroenterology

## 2022-03-30 ENCOUNTER — Other Ambulatory Visit: Payer: Self-pay

## 2022-03-30 ENCOUNTER — Ambulatory Visit: Payer: Commercial Managed Care - PPO | Admitting: Anesthesiology

## 2022-03-30 ENCOUNTER — Encounter
Admission: RE | Disposition: A | Discharge status: Home / Self Care | Payer: Self-pay | Source: Home / Self Care | Attending: Gastroenterology

## 2022-03-30 DIAGNOSIS — D122 Benign neoplasm of ascending colon: Secondary | ICD-10-CM | POA: Insufficient documentation

## 2022-03-30 DIAGNOSIS — G4730 Sleep apnea, unspecified: Secondary | ICD-10-CM | POA: Insufficient documentation

## 2022-03-30 DIAGNOSIS — D124 Benign neoplasm of descending colon: Secondary | ICD-10-CM | POA: Insufficient documentation

## 2022-03-30 DIAGNOSIS — I10 Essential (primary) hypertension: Secondary | ICD-10-CM | POA: Diagnosis not present

## 2022-03-30 DIAGNOSIS — Z1211 Encounter for screening for malignant neoplasm of colon: Secondary | ICD-10-CM | POA: Diagnosis not present

## 2022-03-30 DIAGNOSIS — E669 Obesity, unspecified: Secondary | ICD-10-CM | POA: Insufficient documentation

## 2022-03-30 DIAGNOSIS — F419 Anxiety disorder, unspecified: Secondary | ICD-10-CM | POA: Insufficient documentation

## 2022-03-30 DIAGNOSIS — K635 Polyp of colon: Secondary | ICD-10-CM | POA: Diagnosis not present

## 2022-03-30 DIAGNOSIS — Z6835 Body mass index (BMI) 35.0-35.9, adult: Secondary | ICD-10-CM | POA: Insufficient documentation

## 2022-03-30 HISTORY — PX: COLONOSCOPY WITH PROPOFOL: SHX5780

## 2022-03-30 SURGERY — COLONOSCOPY WITH PROPOFOL
Anesthesia: General

## 2022-03-30 MED ORDER — STERILE WATER FOR IRRIGATION IR SOLN
Status: DC | PRN
  Administered 2022-03-30: 60 mL

## 2022-03-30 MED ORDER — PROPOFOL 10 MG/ML IV BOLUS
INTRAVENOUS | Status: DC | PRN
  Administered 2022-03-30: 100 mg via INTRAVENOUS

## 2022-03-30 MED ORDER — PROPOFOL 500 MG/50ML IV EMUL
INTRAVENOUS | Status: DC | PRN
  Administered 2022-03-30: 200 ug/kg/min via INTRAVENOUS

## 2022-03-30 MED ORDER — SODIUM CHLORIDE 0.9 % IV SOLN: INTRAVENOUS | Status: DC

## 2022-03-30 NOTE — H&P (Signed)
? ? ? ?Martin Bellows, MD ?[address], [place], [place], [place], [address] ?[address], [place], [place], [place], [address] ?Phone: [phone]  ?Fax: [phone] ? ?Primary Care Physician:  Martin Grana, PA-C ? ? ?Pre-Procedure History & Physical: ?HPI:  Martin Parks is a 47 y.o. male is here for an colonoscopy. ?  ?Past Medical History:  ?Diagnosis Date  ? Anxiety   ? Complication of anesthesia   ? hard time waking up after hernia surgery  ? Depression   ? Dysplastic nevus   ? Back x 2, treated at Dr. Jenetta Parks office, will request pathology  ? GERD (gastroesophageal reflux disease)   ? h/o   ? Headache   ? migraines  ? History of kidney stones   ? currently  ? Hypertension   ? h/o years ago due to anxiety-taken off bp meds once anxiety meds started helping  ? Pneumonia   ? h/o   ? Umbilical hernia without obstruction and without gangrene 12/22/2018  ? ? ?Past Surgical History:  ?Procedure Laterality Date  ? HERNIA REPAIR    ? inguinal  ? TONSILLECTOMY    ? UMBILICAL HERNIA REPAIR N/A 03/02/2019  ? Procedure: OPEN HERNIA REPAIR UMBILICAL ADULT WITH MESH;  Surgeon: Martin Epley, MD;  Location: [facility] ORS;  Service: General;  Laterality: N/A;  ? ? ?Prior to Admission medications   ?Medication Sig Start Date End Date Taking? Authorizing Provider  ?ALPRAZolam (XANAX) 0.5 MG tablet Take 0.5 mg by mouth 2 (two) times daily as needed for anxiety.  02/02/16  Yes [provider]  ?b complex vitamins capsule Take 1 capsule by mouth daily.   Yes [provider]  ?cholecalciferol (VITAMIN D3) 25 MCG (1000 UNIT) tablet Take 5,000 Units by mouth daily.   Yes [provider]  ?desvenlafaxine (PRISTIQ) 100 MG 24 hr tablet Take 100 mg by mouth every morning.    Yes [provider]  ?loratadine (CLARITIN) 10 MG tablet Take by mouth.   Yes [provider]  ?Misc Natural Products (OSTEO BI-FLEX ADV TRIPLE ST) TABS Take 1 tablet by mouth daily.   Yes [provider]   ?Omega-3 Fatty Acids (FISH OIL) 1000 MG CAPS Take 1,000 mg by mouth daily.    Yes [provider]  ?Turmeric 500 MG CAPS Take 500 mg by mouth daily.    Yes [provider]  ?fluticasone (FLONASE) 50 MCG/ACT nasal spray SHAKE WELL AND USE 2 SPRAYS IN EACH NOSTRIL DAILY ?Patient taking differently: Place 2 sprays into both nostrils daily. 06/14/16   Martin Liter P, DO  ?hydrocortisone 2.5 % lotion Apply topically 3 (three) times a week. Apply to rash on chin 3 nights weekly, Tuesday, Thursday and Saturday 07/26/21   Martin Bathe, MD  ? ? ?Allergies as of 02/14/2022 - Review Complete 02/14/2022  ?Allergen Reaction Noted  ? Grass extracts [gramineae pollens]  02/14/2022  ? Adhesive [tape] Rash 01/01/2019  ? ? ?Family History  ?Problem Relation Age of Onset  ? Cancer Sister   ?     breast  ? ? ?Social History  ? ?Socioeconomic History  ? Marital status: Married  ?  Spouse name: Martin Parks  ? Number of children: 2  ? Years of education: 74  ? Highest education level: Bachelor's degree (e.g., BA, AB, BS)  ?Occupational History  ? Not on file  ?Tobacco Use  ? Smoking status: Never  ? Smokeless tobacco: Never  ?Vaping Use  ? Vaping Use: Never  used  ?Substance and Sexual Activity  ? Alcohol use: Yes  ?  Alcohol/week: 0.0 standard drinks  ?  Comment: rare  ? Drug use: No  ? Sexual activity: Yes  ?  Partners: Female  ?Other Topics Concern  ? Not on file  ?Social History Narrative  ? Not on file  ? ?Social Determinants of Health  ? ?Financial Resource Strain: Unknown  ? Difficulty of Paying Living Expenses: Patient refused  ?Food Insecurity: Unknown  ? Worried About Charity fundraiser in the Last Year: Patient refused  ? Ran Out of Food in the Last Year: Patient refused  ?Transportation Needs: Unknown  ? Lack of Transportation (Medical): Patient refused  ? Lack of Transportation (Non-Medical): Patient refused  ?Physical Activity: Insufficiently Active  ? Days of Exercise per Week: 2 days  ? Minutes of  Exercise per Session: 30 min  ?Stress: Unknown  ? Feeling of Stress : Patient refused  ?Social Connections: Moderately Isolated  ? Frequency of Communication with Friends and Family: Twice a week  ? Frequency of Social Gatherings with Friends and Family: Twice a week  ? Attends Religious Services: Never  ? Active Member of Clubs or Organizations: No  ? Attends Archivist Meetings: Never  ? Marital Status: Married  ?Intimate Partner Violence: Not At Risk  ? Fear of Current or Ex-Partner: No  ? Emotionally Abused: No  ? Physically Abused: No  ? Sexually Abused: No  ? ? ?Review of Systems: ?See HPI, otherwise negative ROS ? ?Physical Exam: ?There were no vitals taken for this visit. ?General:   Alert,  pleasant and cooperative in NAD ?Head:  Normocephalic and atraumatic. ?Neck:  Supple; no masses or thyromegaly. ?Lungs:  Clear throughout to auscultation, normal respiratory effort.    ?Heart:  +S1, +S2, Regular rate and rhythm, No edema. ?Abdomen:  Soft, nontender and nondistended. Normal bowel sounds, without guarding, and without rebound.   ?Neurologic:  Alert and  oriented x4;  grossly normal neurologically. ? ?Impression/Plan: ?Martin Parks is here for an colonoscopy to be performed for Screening colonoscopy average risk   ?Risks, benefits, limitations, and alternatives regarding  colonoscopy have been reviewed with the patient.  Questions have been answered.  All parties agreeable. ? ? ?Martin Bellows, MD  03/30/2022, 9:11 AM ? ?

## 2022-03-30 NOTE — Op Note (Signed)
[facility] ?Gastroenterology ?Patient Name: Martin Parks ?Procedure Date: 03/30/2022 9:28 AM ?MRN: [id number] ?Account #: [id number] ?Date of Birth: 03/02/1975 ?Admit Type: Outpatient ?Age: 47 ?Room: [facility] ENDO ROOM 3 ?Gender: Male ?Note Status: Finalized ?Instrument Name: Colonoscope [id number] ?Procedure:             Colonoscopy ?Indications:           Screening for colorectal malignant neoplasm ?Providers:             Jonathon Bellows MD, MD ?Referring MD:          Delsa Grana (Referring MD) ?Medicines:             Monitored Anesthesia Care ?Complications:         No immediate complications. ?Procedure:             Pre-Anesthesia Assessment: ?                       - Prior to the procedure, a History and Physical was  ?                       performed, and patient medications, allergies and  ?                       sensitivities were reviewed. The patient's tolerance  ?                       of previous anesthesia was reviewed. ?                       - The risks and benefits of the procedure and the  ?                       sedation options and risks were discussed with the  ?                       patient. All questions were answered and informed  ?                       consent was obtained. ?                       - ASA Grade Assessment: II - A patient with mild  ?                       systemic disease. ?                       After obtaining informed consent, the colonoscope was  ?                       passed under direct vision. Throughout the procedure,  ?                       the patient's blood pressure, pulse, and oxygen  ?                       saturations were monitored continuously. The  ?                       Colonoscope was introduced through  the anus and  ?                       advanced to the the cecum, identified by the  ?                       appendiceal orifice. The colonoscopy was performed  ?                       with ease. The patient tolerated the procedure well.  ?                        The quality of the bowel preparation was excellent. ?Findings: ?     The perianal and digital rectal examinations were normal. ?     Five sessile polyps were found in the ascending colon. The polyps were 5  ?     to 7 mm in size. These polyps were removed with a cold snare. Resection  ?     and retrieval were complete. ?     A 5 mm polyp was found in the descending colon. The polyp was sessile.  ?     The polyp was removed with a hot snare. Resection and retrieval were  ?     complete. ?     The exam was otherwise without abnormality on direct and retroflexion  ?     views. ?Impression:            - Five 5 to 7 mm polyps in the ascending colon,  ?                       removed with a cold snare. Resected and retrieved. ?                       - One 5 mm polyp in the descending colon, removed with  ?                       a hot snare. Resected and retrieved. ?                       - The examination was otherwise normal on direct and  ?                       retroflexion views. ?Recommendation:        - Discharge patient to home (with escort). ?                       - Resume previous diet. ?                       - Continue present medications. ?                       - Await pathology results. ?                       - Repeat colonoscopy for surveillance based on  ?                       pathology results. ?Procedure Code(s):     --- Professional --- ?  45385, Colonoscopy, flexible; with removal of  ?                       tumor(s), polyp(s), or other lesion(s) by snare  ?                       technique ?Diagnosis Code(s):     --- Professional --- ?                       K63.5, Polyp of colon ?                       Z12.11, Encounter for screening for malignant neoplasm  ?                       of colon ?CPT copyright 2019 American Medical Association. All rights reserved. ?The codes documented in this report are preliminary and upon coder review may  ?be revised to meet  current compliance requirements. ?Jonathon Bellows, MD ?Jonathon Bellows MD, MD ?03/30/2022 9:53:54 AM ?This report has been signed electronically. ?Number of Addenda: 0 ?Note Initiated On: 03/30/2022 9:28 AM ?Scope Withdrawal Time: 0 hours 15 minutes 28 seconds  ?Total Procedure Duration: 0 hours 18 minutes 33 seconds  ?Estimated Blood Loss:  Estimated blood loss: none. ?     [facility] ?

## 2022-03-30 NOTE — Anesthesia Postprocedure Evaluation (Signed)
Anesthesia Post Note ? ?Patient: Martin Parks ? ?Procedure(s) Performed: COLONOSCOPY WITH PROPOFOL ? ?Patient location during evaluation: Endoscopy ?Anesthesia Type: General ?Level of consciousness: awake and alert ?Pain management: pain level controlled ?Vital Signs Assessment: post-procedure vital signs reviewed and stable ?Respiratory status: spontaneous breathing, nonlabored ventilation and respiratory function stable ?Cardiovascular status: blood pressure returned to baseline and stable ?Postop Assessment: no apparent nausea or vomiting ?Anesthetic complications: no ? ? ?No notable events documented. ? ? ?Last Vitals:  ?Vitals:  ? 03/30/22 1017 03/30/22 1020  ?BP: (!) 127/95 (!) 137/94  ?Pulse: 71 66  ?Resp: 19 (!) 26  ?Temp:    ?SpO2: 99% 97%  ?  ?Last Pain:  ?Vitals:  ? 03/30/22 1000  ?TempSrc: Temporal  ?PainSc:   ? ? ?  ?  ?  ?  ?  ?  ? ?[place] ? ? ? ? ?

## 2022-03-30 NOTE — Anesthesia Procedure Notes (Signed)
Date/Time: 03/30/2022 9:38 AM ?Performed by: Nelda Marseille, CRNA ?Pre-anesthesia Checklist: Patient identified, Emergency Drugs available, Suction available, Patient being monitored and Timeout performed ?Oxygen Delivery Method: Simple face mask ? ? ? ? ?

## 2022-03-30 NOTE — Transfer of Care (Signed)
Immediate Anesthesia Transfer of Care Note ? ?Patient: Martin Parks ? ?Procedure(s) Performed: COLONOSCOPY WITH PROPOFOL ? ?Patient Location: PACU ? ?Anesthesia Type:General ? ?Level of Consciousness: sedated ? ?Airway & Oxygen Therapy: Patient Spontanous Breathing and Patient connected to face mask oxygen ? ?Post-op Assessment: Report given to RN and Post -op Vital signs reviewed and stable ? ?Post vital signs: Reviewed and stable ? ?Last Vitals:  ?Vitals Value Taken Time  ?BP 105/71 03/30/22 0956  ?Temp    ?Pulse 89 03/30/22 0958  ?Resp 13 03/30/22 0958  ?SpO2 98 % 03/30/22 0958  ?Vitals shown include unvalidated device data. ? ?Last Pain:  ?Vitals:  ? 03/30/22 0912  ?TempSrc: Temporal  ?PainSc: 0-No pain  ?   ? ?  ? ?Complications: No notable events documented. ?

## 2022-03-30 NOTE — Anesthesia Preprocedure Evaluation (Signed)
Anesthesia Evaluation  ?Patient identified by MRN, date of birth, ID band ?Patient awake ? ? ? ?Reviewed: ?Allergy & Precautions, NPO status , Patient's Chart, lab work & pertinent test results ? ?Airway ?Mallampati: III ? ?TM Distance: >3 FB ?Neck ROM: full ? ? ? Dental ?no notable dental hx. ? ?  ?Pulmonary ?sleep apnea and Continuous Positive Airway Pressure Ventilation ,  ?  ?Pulmonary exam normal ? ? ? ? ? ? ? Cardiovascular ?hypertension, negative cardio ROS ?Normal cardiovascular exam ? ? ?  ?Neuro/Psych ?PSYCHIATRIC DISORDERS Anxiety negative neurological ROS ?   ? GI/Hepatic ?negative GI ROS, Neg liver ROS,   ?Endo/Other  ?negative endocrine ROS ? Renal/GU ?negative Renal ROS  ?negative genitourinary ?  ?Musculoskeletal ? ? Abdominal ?(+) + obese,   ?Peds ? Hematology ?negative hematology ROS ?(+)   ?Anesthesia Other Findings ?Past Medical History: ?No date: Anxiety ?No date: Complication of anesthesia ?    Comment:  hard time waking up after hernia surgery ?No date: Depression ?No date: Dysplastic nevus ?    Comment:  Back x 2, treated at Dr. Jenetta Downer office, will request  ?             pathology ?No date: GERD (gastroesophageal reflux disease) ?    Comment:  h/o  ?No date: Headache ?    Comment:  migraines ?No date: History of kidney stones ?    Comment:  currently ?No date: Hypertension ?    Comment:  h/o years ago due to anxiety-taken off bp meds once  ?             anxiety meds started helping ?No date: Pneumonia ?    Comment:  h/o  ?81/85/[id number]: Umbilical hernia without obstruction and without gangrene ? ?Past Surgical History: ?No date: HERNIA REPAIR ?    Comment:  inguinal ?No date: TONSILLECTOMY ?09/06/262: UMBILICAL HERNIA REPAIR; N/A ?    Comment:  Procedure: OPEN HERNIA REPAIR UMBILICAL ADULT WITH MESH; ?             Surgeon: Vickie Epley, MD;  Location: [facility] ORS;   ?             Service: General;  Laterality: N/A; ? ?BMI   ? Body Mass Index: 35.29 kg/m?  ?   ? ? Reproductive/Obstetrics ?negative OB ROS ? ?  ? ? ? ? ? ? ? ? ? ? ? ? ? ?  ?  ? ? ? ? ? ? ? ? ?Anesthesia Physical ?Anesthesia Plan ? ?ASA: 2 ? ?Anesthesia Plan: General  ? ?Post-op Pain Management:   ? ?Induction: Intravenous ? ?PONV Risk Score and Plan: Propofol infusion and TIVA ? ?Airway Management Planned: Natural Airway and Simple Face Mask ? ?Additional Equipment:  ? ?Intra-op Plan:  ? ?Post-operative Plan:  ? ?Informed Consent: I have reviewed the patients History and Physical, chart, labs and discussed the procedure including the risks, benefits and alternatives for the proposed anesthesia with the patient or authorized representative who has indicated his/her understanding and acceptance.  ? ? ? ?Dental Advisory Given ? ?Plan Discussed with: Anesthesiologist, CRNA and Surgeon ? ?Anesthesia Plan Comments:   ? ? ? ? ? ? ?Anesthesia Quick Evaluation ? ?

## 2022-04-02 ENCOUNTER — Encounter: Payer: Self-pay | Admitting: Gastroenterology

## 2022-04-02 LAB — SURGICAL PATHOLOGY

## 2022-04-03 ENCOUNTER — Encounter: Payer: Self-pay | Admitting: Gastroenterology

## 2022-04-03 LAB — TESTOSTERONE: Testosterone: 63 ng/dL — ABNORMAL LOW (ref 250–827)

## 2022-04-03 NOTE — Addendum Note (Signed)
Addended by: Delsa Grana on: 04/03/2022 01:17 PM ? ? Modules accepted: Orders ? ?

## 2022-04-04 ENCOUNTER — Encounter: Payer: Self-pay | Admitting: Family Medicine

## 2022-04-06 NOTE — Telephone Encounter (Signed)
Routine pt's msgs/requests to you both.  ?

## 2022-04-13 ENCOUNTER — Encounter: Payer: Self-pay | Admitting: Family Medicine

## 2022-04-13 ENCOUNTER — Ambulatory Visit: Payer: Commercial Managed Care - PPO | Admitting: Family Medicine

## 2022-04-13 VITALS — BP 120/76 | HR 93 | Temp 98.0°F | Resp 16 | Ht 71.0 in | Wt 259.4 lb

## 2022-04-13 DIAGNOSIS — N62 Hypertrophy of breast: Secondary | ICD-10-CM | POA: Diagnosis not present

## 2022-04-13 DIAGNOSIS — R7989 Other specified abnormal findings of blood chemistry: Secondary | ICD-10-CM

## 2022-04-13 DIAGNOSIS — Z9989 Dependence on other enabling machines and devices: Secondary | ICD-10-CM

## 2022-04-13 DIAGNOSIS — E291 Testicular hypofunction: Secondary | ICD-10-CM

## 2022-04-13 DIAGNOSIS — F419 Anxiety disorder, unspecified: Secondary | ICD-10-CM

## 2022-04-13 DIAGNOSIS — G4733 Obstructive sleep apnea (adult) (pediatric): Secondary | ICD-10-CM

## 2022-04-13 DIAGNOSIS — R635 Abnormal weight gain: Secondary | ICD-10-CM

## 2022-04-13 DIAGNOSIS — R4586 Emotional lability: Secondary | ICD-10-CM

## 2022-04-13 DIAGNOSIS — G479 Sleep disorder, unspecified: Secondary | ICD-10-CM

## 2022-04-13 DIAGNOSIS — Z6836 Body mass index (BMI) 36.0-36.9, adult: Secondary | ICD-10-CM

## 2022-04-13 DIAGNOSIS — Z79899 Other long term (current) drug therapy: Secondary | ICD-10-CM

## 2022-04-13 MED ORDER — BUSPIRONE HCL 5 MG PO TABS: 5.0000 mg | ORAL_TABLET | Freq: Two times a day (BID) | ORAL | 0 refills | Status: DC | PRN

## 2022-04-13 MED ORDER — HYDROXYZINE HCL 25 MG PO TABS: 25.0000 mg | ORAL_TABLET | Freq: Three times a day (TID) | ORAL | 0 refills | Status: DC | PRN

## 2022-04-13 NOTE — Progress Notes (Signed)
Contraindications to use -- Several possible contraindications to testosterone therapy should be evaluated before initiating testosterone treatment: ??Prostate cancer - A man who has a history of prostate cancer should generally not be treated with testosterone. A possible exception is a hypogonadal man who had a radical prostatectomy for cancer confined to the prostate and has been free of disease and has had an undetectable prostate-specific antigen (PSA) for at least two years [2,52]. ?The use of testosterone replacement therapy in prostate cancer survivors with hypogonadism due to prior androgen deprivation therapy is controversial. This topic is reviewed separately. (See "Overview of approach to prostate cancer survivors".) ?Other men should be evaluated for the possibility of previously undiagnosed prostate cancer. A man over 20 years (or over 70 years if he is [place] American or has a history of prostate cancer in a first-degree relative) should have a digital rectal examination and a serum PSA measurement. If a prostate nodule is detected or the PSA is >4 ng/mL or >3 ng/mL in a man of high risk, the man should be referred for urologic consultation. ??Breast cancer - Testosterone is aromatized to estradiol, so men who have breast cancer should not be treated with testosterone. ??Lower urinary tract symptoms (LUTS) that are severe (table 2). LUTS should be assessed by the International Prostate Symptom Score (IPSS) (table 2) [53]. If warranted by symptoms, the urine flow rate [54] and post-void residual urine in the bladder by ultrasonography [55] should be measured before beginning treatment (see "Clinical manifestations and diagnostic evaluation of benign prostatic hyperplasia"). Moderate or severe LUTS (eg, score >19) due to benign prostatic hyperplasia (BPH) should be treated before beginning testosterone replacement. (See "Medical treatment of benign prostatic hyperplasia" and "Surgical treatment of  benign prostatic hyperplasia (BPH)".) ??Erythrocytosis, eg hematocrit >50 percent. Testosterone stimulates erythropoiesis, so the hematocrit should be measured before initiating testosterone treatment, and if it is elevated, the cause should be sought and the condition treated before testosterone treatment is initiated. ??Sleep apnea that is severe and untreated (see "Clinical presentation and diagnosis of obstructive sleep apnea in adults") may be worsened [2]; the clinician should inquire about symptoms, such as excessive daytime sleepiness and apnea witnessed during sleep by a partner. If indicated, polysomnography should be performed (see "Clinical presentation and diagnosis of obstructive sleep apnea in adults"). Patients whose sleep apnea is well treated with continuous positive airway pressure (CPAP) may take testosterone treatment. ??Uncontrolled heart failure [56]. Testosterone has slight sodium retaining properties, so severe heart failure should be treated before beginning testosterone treatment. ? ?Hypogonadal adult men -- Testosterone should be administered only to an adult male who is hypogonadal, as evidenced by clinical symptoms and signs consistent with androgen deficiency and a subnormal morning (8 to 10 AM) serum testosterone concentration on three separate occasions. Ideally, samples should be drawn fasting; in one report, an oral glucose load suppressed serum testosterone concentrations acutely [10]. Symptoms and signs suggestive of androgen deficiency include low libido, decreased morning erections, loss of body hair, low bone mineral density, gynecomastia, and small testes. Symptoms and signs such as fatigue, depression, anemia, reduced muscle strength, and increased fat mass are less specific. The beneficial effects of testosterone in these men are clear, and they should be treated with testosterone to raise their serum testosterone concentrations to normal.  ? ?Prior msgs from pt after  virtual visit for gynecomastia and labs work done -  ?04/04/2022: ?Hello Martin Parks, ?I've been doing some reading and thinking about what I have been reading with my wife.Marland KitchenMarland Kitchen  I did have a colonoscopy on Friday morning, 03/30/2022 and the blood draw was 04/02/2022.  I read that gut health can have some effect on 'T' levels.  Also, I was curious as to what the next labs to be run were considering some of the information I have been reading.  One being vitamin D levels, another big one is thyroid, which I know TSH is usually tested but T3 and T4 are not usually tested and and it sounds like they maybe should be with low 'T'.  My anxiety and stress has been through the roof lately even with taking the generic Pristic and the generic xanex, I am wondering what my cortisol levels are, and with that I am wondering if the pristic and/or the xanex are the cause, or a perfect storm and a little or a lot of all of the above.   ?With best regards, and thank you for your support on this,  ?Martin Parks   ? ? ? ?04/12/2022: ?If I am going to come in for more labs, I would like to not waste time and go to a lab that can do full blood work up and test for the aforementioned list including, but not limited to, cortisol, thyroid (eg,  TSH, T3, and T4) along with  insulin and any others. ?If a referral is needed to go to another lab, and a follow-up appointment to discuss results, let?s please get it all scheduled as soon as possible. ?Sincerely  ?Martin Parks. ? ? ?We do not screen for Vit D - it is encouraged by the USPSTF and AAFP to NOT screen vit D - all his labs show normal calcium which is related to Vit D - we usually only are able to get insurance to approve checking Vit D with severe kidney disease, osteoporosis, or calcium abnormalities to name a few.   ? ? ? ? ?

## 2022-04-13 NOTE — Patient Instructions (Signed)
Hypogonadism, Male ? ?Male hypogonadism is a condition of having a level of testosterone that is lower than normal. Testosterone is a chemical, or hormone, that is made mainly in the testicles. ?In boys, testosterone is responsible for the development of male characteristics during puberty. These include: ?Making the penis bigger. ?Growing and building the muscles. ?Growing facial hair. ?Deepening the voice. ?In adult men, testosterone is responsible for maintaining: ?An interest in sex and the ability to have sex. ?Muscle mass. ?Sperm production. ?Red blood cell production. ?Bone strength. ?Testosterone also gives men energy and a sense of well-being. ?Testosterone normally decreases as men age and the testicles make less testosterone. Testosterone levels can vary from man to man. Not all men will have signs and symptoms of low testosterone. Weight, alcohol use, medicines, and certain medical conditions can affect a man's testosterone level. ?What are the causes? ?This condition is caused by: ?A natural decrease in testosterone that occurs as a man grows older. This is the main cause of this condition. ?Use of medicines, such as antidepressants, steroids, and opioids. ?Diseases and conditions that affect the testicles or the making of testosterone. These include: ?Injury or damage to the testicles from trauma, cancer, cancer treatment, or infection. ?Diabetes. ?Sleep apnea. ?Genetic conditions that men are born with. ?Disease of the pituitary gland. This gland is in the brain. It produces hormones. ?Obesity. ?Metabolic syndrome. This is a group of diseases that affect blood pressure, blood sugar, cholesterol, and belly fat. ?HIV or AIDS. ?Alcohol abuse. ?Kidney failure. ?Other long-term or chronic diseases. ?What are the signs or symptoms? ?Common symptoms of this condition include: ?Loss of interest in sex (low sex drive). ?Inability to have or maintain an erection (erectile dysfunction). ?Feeling tired  (fatigue). ?Mood changes, like irritability or depression. ?Loss of muscle and body hair. ?Infertility. ?Large breasts. ?Weight gain (obesity). ?How is this diagnosed? ?Your health care provider can diagnose hypogonadism based on: ?Your signs and symptoms. ?A physical exam to check your testosterone levels. This includes blood tests. Testosterone levels can change throughout the day. Levels are highest in the morning. You may need to have repeat blood tests before getting a diagnosis of hypogonadism. ?Depending on your medical history and test results, your health care provider may also do other tests to find the cause of low testosterone. ?How is this treated? ?This condition is treated with testosterone replacement therapy. Testosterone can be given by: ?Injection or through pellets inserted under the skin. ?Gels or patches placed on the skin or in the mouth. ?Testosterone therapy is not for everyone. It has risks and side effects. Your health care provider will consider your medical history, your risk for prostate cancer, your age, and your symptoms before putting you on testosterone replacement therapy. ?Follow these instructions at home: ?Take over-the-counter and prescription medicines only as told by your health care provider. ?Eat foods that are high in fiber, such as beans, whole grains, and fresh fruits and vegetables. Limit foods that are high in fat and processed sugars, such as fried or sweet foods. ?If you drink alcohol: ?Limit how much you have to 0-2 drinks a day. ?Know how much alcohol is in your drink. In the [place] one drink equals one 12 oz bottle of beer (355 mL), one 5 oz glass of wine (148 mL), or one 1? oz glass of hard liquor (44 mL). ?Return to your normal activities as told by your health care provider. Ask your health care provider what activities are safe for you. ?Keep all  follow-up visits. This is important. ?Contact a health care provider if: ?You have any of the signs or symptoms of  low testosterone. ?You have any side effects from testosterone therapy. ?Summary ?Male hypogonadism is a condition of having a level of testosterone that is lower than normal. ?The natural drop in testosterone production that occurs with age is the most common cause of this condition. ?Low testosterone can also be caused by many diseases and conditions that affect the testicles and the making of testosterone. ?This condition is treated with testosterone replacement therapy. ?There are risks and side effects of testosterone therapy. Your health care provider will consider your age, medical history, symptoms, and risks for prostate cancer before putting you on testosterone therapy. ?This information is not intended to replace advice given to you by your health care provider. Make sure you discuss any questions you have with your health care provider. ?Document Revised: 08/18/2020 Document Reviewed: 08/18/2020 ?Elsevier Patient Education ? [place]. ? ?Starting testosterone replacement - Contraindications to use -- Several possible contraindications to testosterone therapy should be evaluated before initiating testosterone treatment: ??Prostate cancer - A man who has a history of prostate cancer should generally not be treated with testosterone. A possible exception is a hypogonadal man who had a radical prostatectomy for cancer confined to the prostate and has been free of disease and has had an undetectable prostate-specific antigen (PSA) for at least two years [2,52]. ?The use of testosterone replacement therapy in prostate cancer survivors with hypogonadism due to prior androgen deprivation therapy is controversial. This topic is reviewed separately. (See "Overview of approach to prostate cancer survivors".) ?Other men should be evaluated for the possibility of previously undiagnosed prostate cancer. A man over 52 years (or over 50 years if he is [place] American or has a history of prostate cancer in a  first-degree relative) should have a digital rectal examination and a serum PSA measurement. If a prostate nodule is detected or the PSA is >4 ng/mL or >3 ng/mL in a man of high risk, the man should be referred for urologic consultation. ??Breast cancer - Testosterone is aromatized to estradiol, so men who have breast cancer should not be treated with testosterone. ??Lower urinary tract symptoms (LUTS) that are severe (table 2). LUTS should be assessed by the International Prostate Symptom Score (IPSS) (table 2) [53]. If warranted by symptoms, the urine flow rate [54] and post-void residual urine in the bladder by ultrasonography [55] should be measured before beginning treatment (see "Clinical manifestations and diagnostic evaluation of benign prostatic hyperplasia"). Moderate or severe LUTS (eg, score >19) due to benign prostatic hyperplasia (BPH) should be treated before beginning testosterone replacement. (See "Medical treatment of benign prostatic hyperplasia" and "Surgical treatment of benign prostatic hyperplasia (BPH)".) ??Erythrocytosis, eg hematocrit >50 percent. Testosterone stimulates erythropoiesis, so the hematocrit should be measured before initiating testosterone treatment, and if it is elevated, the cause should be sought and the condition treated before testosterone treatment is initiated. ??Sleep apnea that is severe and untreated (see "Clinical presentation and diagnosis of obstructive sleep apnea in adults") may be worsened [2]; the clinician should inquire about symptoms, such as excessive daytime sleepiness and apnea witnessed during sleep by a partner. If indicated, polysomnography should be performed (see "Clinical presentation and diagnosis of obstructive sleep apnea in adults"). Patients whose sleep apnea is well treated with continuous positive airway pressure (CPAP) may take testosterone treatment. ??Uncontrolled heart failure [56]. Testosterone has slight sodium retaining properties, so  severe heart failure should  be treated before beginning testosterone treatment. ?  ?Hypogonadal adult men -- Testosterone should be administered only to an adult male who is hypogonadal, as evidenced by clinical

## 2022-04-13 NOTE — Progress Notes (Signed)
? ? ?Patient ID: [name], male    DOB: Jan 13, 1975, 47 y.o.   MRN: [id number] ? ?PCP: Martin Grana, PA-C ? ?Chief Complaint  ?Patient presents with  ? Lab results  ? ? ?Subjective:  ? ?Martin Parks is a 47 y.o. male, presents to clinic with CC of the following: ? ?HPI  ?Here for f/up on abnormal labs ?Also has additional sx and concerns ?Particularly mood swings, anxiety agitation, poor sleep, gradual worsening particularly over the past year ?He feels heart race, but HR is in normal range today ? ?Pulse Readings from Last 6 Encounters:  ?04/13/22 93  ?03/30/22 66  ?02/14/22 89  ?02/09/21 84  ?02/09/20 95  ?03/17/19 88  ? ? ?A lot of mood and energy changes  ?He is concerned with thyroid and cortisol and how that may affect his testosterone ? ?Lab Results  ?Component Value Date  ? TSH 1.67 12/22/2018  ? ?Weight increased over COVID and with good dietary effort he has been unable to loose weight ?Wt Readings from Last 10 Encounters:  ?04/13/22 259 lb 6.4 oz (117.7 kg)  ?03/30/22 253 lb (114.8 kg)  ?02/14/22 259 lb 9.6 oz (117.8 kg)  ?02/09/21 253 lb (114.8 kg)  ?02/09/20 241 lb 4.8 oz (109.5 kg)  ?03/17/19 232 lb 3.2 oz (105.3 kg)  ?03/02/19 225 lb (102.1 kg)  ?02/06/19 231 lb (104.8 kg)  ?01/29/19 230 lb (104.3 kg)  ?01/01/19 238 lb 9.6 oz (108.2 kg)  ? ?BMI Readings from Last 5 Encounters:  ?04/13/22 36.18 kg/m?  ?03/30/22 35.29 kg/m?  ?02/14/22 36.21 kg/m?  ?02/09/21 35.29 kg/m?  ?02/09/20 33.65 kg/m?  ? ?Watching diet ?Meditation, mood and sx much worse  ? ?Over the years lipids and prediabetes developed and he's concerned it may be from pristique and med SE ?He is hoping to get off meds, but not right now due to feeling worse. ? ?He denies hot or cold intolerance -  ? ?OSA on CPAP good compliance but still sleep is disrupted, no trouble falling asleep  ? ? ? ?Patient Active Problem List  ? Diagnosis Date Noted  ? Class 2 severe obesity with serious comorbidity and body mass index (BMI) of 35.0 to  35.9 in adult [facility]) 02/09/2021  ? OSA on CPAP 06/05/2020  ? Prediabetes 02/10/2020  ? Hyperactivity 12/22/2018  ? Generalized anxiety disorder 12/22/2018  ? Hyperlipidemia LDL goal <100 12/22/2018  ? ? ? ? ?Current Outpatient Medications:  ?  ALPRAZolam (XANAX) 0.5 MG tablet, Take 0.5 mg by mouth 2 (two) times daily as needed for anxiety. , Disp: , Rfl: 5 ?  b complex vitamins capsule, Take 1 capsule by mouth daily., Disp: , Rfl:  ?  cholecalciferol (VITAMIN D3) 25 MCG (1000 UNIT) tablet, Take 5,000 Units by mouth daily., Disp: , Rfl:  ?  desvenlafaxine (PRISTIQ) 100 MG 24 hr tablet, Take 100 mg by mouth every morning. , Disp: , Rfl:  ?  fluticasone (FLONASE) 50 MCG/ACT nasal spray, SHAKE WELL AND USE 2 SPRAYS IN EACH NOSTRIL DAILY (Patient taking differently: Place 2 sprays into both nostrils daily.), Disp: 16 g, Rfl: 12 ?  hydrocortisone 2.5 % lotion, Apply topically 3 (three) times a week. Apply to rash on chin 3 nights weekly, Tuesday, Thursday and Saturday, Disp: 59 mL, Rfl: 6 ?  loratadine (CLARITIN) 10 MG tablet, Take by mouth., Disp: , Rfl:  ?  Misc Natural Products (OSTEO BI-FLEX ADV TRIPLE ST) TABS, Take 1 tablet by mouth daily., Disp: , Rfl:  ?  Omega-3 Fatty Acids (FISH OIL) 1000 MG CAPS, Take 1,000 mg by mouth daily. , Disp: , Rfl:  ?  Turmeric 500 MG CAPS, Take 500 mg by mouth daily. , Disp: , Rfl:  ? ? ?Allergies  ?Allergen Reactions  ? Grass Extracts [Gramineae Pollens]   ? Adhesive [Tape] Rash  ? ? ? ?Social History  ? ?Tobacco Use  ? Smoking status: Never  ? Smokeless tobacco: Never  ?Vaping Use  ? Vaping Use: Never used  ?Substance Use Topics  ? Alcohol use: Yes  ?  Alcohol/week: 0.0 standard drinks  ?  Comment: rare  ? Drug use: No  ?  ? ? ?Chart Review Today: ?I personally reviewed active problem list, medication list, allergies, family history, social history, health maintenance, notes from last encounter, lab results, imaging with the patient/caregiver today. ? ? ?Review of Systems   ?Constitutional: Negative.   ?HENT: Negative.    ?Eyes: Negative.   ?Respiratory: Negative.    ?Cardiovascular: Negative.   ?Gastrointestinal: Negative.   ?Endocrine: Negative.  Negative for cold intolerance, heat intolerance and polydipsia.  ?Genitourinary: Negative.   ?Musculoskeletal: Negative.   ?Skin: Negative.   ?Allergic/Immunologic: Negative.   ?Neurological: Negative.   ?Hematological: Negative.   ?Psychiatric/Behavioral: Negative.    ?All other systems reviewed and are negative. ? ?   ?Objective:  ? ?Vitals:  ? 04/13/22 1551  ?BP: 120/76  ?Pulse: 93  ?Resp: 16  ?Temp: 98 ?F (36.7 ?C)  ?TempSrc: Oral  ?SpO2: 95%  ?Weight: 259 lb 6.4 oz (117.7 kg)  ?Height: 5' 11"  (1.803 m)  ?  ?Body mass index is 36.18 kg/m?. ? ?Physical Exam ?Vitals and nursing note reviewed.  ?Constitutional:   ?   General: He is not in acute distress. ?   Appearance: Normal appearance. He is well-developed and well-groomed. He is obese. He is not ill-appearing, toxic-appearing or diaphoretic.  ?HENT:  ?   Head: Normocephalic and atraumatic.  ?   Right Ear: External ear normal.  ?   Left Ear: External ear normal.  ?   Nose: Nose normal.  ?   Mouth/Throat:  ?   Pharynx: No oropharyngeal exudate.  ?Eyes:  ?   General: No scleral icterus.    ?   Right eye: No discharge.     ?   Left eye: No discharge.  ?   Conjunctiva/sclera: Conjunctivae normal.  ?Neck:  ?   Trachea: No tracheal deviation.  ?Cardiovascular:  ?   Rate and Rhythm: Normal rate and regular rhythm.  ?   Pulses: Normal pulses.  ?   Heart sounds: Normal heart sounds. No murmur heard. ?  No friction rub. No gallop.  ?Pulmonary:  ?   Effort: Pulmonary effort is normal. No respiratory distress.  ?   Breath sounds: Normal breath sounds. No stridor. No wheezing or rales.  ?Chest:  ?   Chest wall: No tenderness.  ?Abdominal:  ?   General: Bowel sounds are normal. There is no distension.  ?   Palpations: Abdomen is soft.  ?   Tenderness: There is no abdominal tenderness. There is no  guarding or rebound.  ?Musculoskeletal:     ?   General: Normal range of motion.  ?   Cervical back: Normal range of motion.  ?Skin: ?   General: Skin is warm and dry.  ?   Coloration: Skin is not jaundiced or pale.  ?   Findings: No rash.  ?Neurological:  ?   Mental Status: He  is alert.  ?   Motor: No abnormal muscle tone.  ?   Coordination: Coordination normal.  ?Psychiatric:     ?   Mood and Affect: Mood normal.     ?   Behavior: Behavior normal. Behavior is cooperative.  ?  ? ?Results for orders placed or performed during the hospital encounter of 03/30/22  ?Surgical pathology  ?Result Value Ref Range  ? SURGICAL PATHOLOGY    ?  SURGICAL PATHOLOGY ?CASE: [id number] ?PATIENT: Sherley Custodio ?Surgical Pathology Report ? ? ? ? ?Specimen Submitted: ?A. Colon polyp x5, ascending; cold snare ?B. Colon polyp, descending; cold snare ? ?Clinical History: Colon cancer screening. Finding: Colon polyps ? ? ? ? ?DIAGNOSIS: ?A.  COLON POLYP X 5, ASCENDING; COLD SNARE: ?- TUBULAR ADENOMAS, 2 FRAGMENTS. ?- 3 FRAGMENTS OF UNREMARKABLE COLONIC MUCOSA. ?- NEGATIVE FOR HIGH-GRADE DYSPLASIA AND MALIGNANCY. ? ?B.  COLON POLYP, DESCENDING; COLD SNARE: ?- TUBULAR ADENOMA. ?- NEGATIVE FOR HIGH-GRADE DYSPLASIA AND MALIGNANCY. ? ?GROSS DESCRIPTION: ?A. Labeled: Ascending colon polyp cold snare x5 ?Received: Formalin ?Collection time: 9:39 AM on 03/30/2022 ?Placed into formalin time: 9:39 AM on 03/30/2022 ?Tissue fragment(s): Multiple ?Size: Aggregate, 1.7 x 0.5 x 0.3 cm ?Description: Received are fragments of tan soft tissue.  The largest ?fragment has a resection margin which is inked blue.  This fragment is ?bisected. ?Entirely su bmitted in cassettes 1-2 with the bisected fragment in ?cassette 1 and the remaining fragments in cassette 2. ? ?B. Labeled: Descending colon polyp cold snare ?Received: Formalin ?Collection time: 9:47 AM on 03/30/2022 ?Placed into formalin time: 9:47 AM on 03/30/2022 ?Tissue fragment(s): Multiple ?Size:  Aggregate, 1.5 x 0.5 x 0.2 cm ?Description: Received is at least 1 fragment of tan soft tissue admixed ?with intestinal debris.  The ratio of soft tissue to intestinal debris ?is 30: 70. ?Entirely submitted in 1 cassett

## 2022-04-14 LAB — TSH+FREE T4: TSH W/REFLEX TO FT4: 1.95 mIU/L (ref 0.40–4.50)

## 2022-04-14 LAB — PROLACTIN: Prolactin: 3.9 ng/mL (ref 2.0–18.0)

## 2022-04-19 LAB — HCG, SERUM, QUALITATIVE: Preg, Serum: NEGATIVE

## 2022-04-19 LAB — ESTRADIOL, FREE
Estradiol, Free: 0.36 pg/mL
Estradiol: 13 pg/mL (ref <=29)

## 2022-04-19 LAB — LUTEINIZING HORMONE: LH: 2.2 m[IU]/mL (ref 1.5–9.3)

## 2022-04-19 LAB — TESTOSTERONE: Testosterone: 140 ng/dL — ABNORMAL LOW (ref 250–827)

## 2022-04-20 ENCOUNTER — Encounter: Payer: Self-pay | Admitting: Family Medicine

## 2022-04-20 DIAGNOSIS — R7989 Other specified abnormal findings of blood chemistry: Secondary | ICD-10-CM

## 2022-04-20 DIAGNOSIS — N62 Hypertrophy of breast: Secondary | ICD-10-CM

## 2022-05-07 ENCOUNTER — Other Ambulatory Visit: Payer: Self-pay | Admitting: Family Medicine

## 2022-05-07 DIAGNOSIS — R7989 Other specified abnormal findings of blood chemistry: Secondary | ICD-10-CM

## 2022-05-07 NOTE — Progress Notes (Signed)
Endo referral was placed more than 2 weeks ago - pt at appt stated his insurance may not need referral and I asked for his preference when sending him lab results - did not hear back so endo was put in with all of his other concerns ? ?I will also now put in urology - they may be faster (as previously explained to pt) but they will not be able to discuss or address any of this other hormonal concerns or nuanced q's about lab results. ? ? ? ?Prior referral 04/20/2022 - status authorized ?[facility] ?Referral ?Referral # [id number] ?Status History ? ?Date Change User  ?04/23/2022 From New Request to [facility], [place], [place]  ? ?Delsa Grana, PA-C ?05/07/22 4:53 PM ? ?

## 2022-05-08 ENCOUNTER — Telehealth: Payer: Self-pay

## 2022-05-08 DIAGNOSIS — R7989 Other specified abnormal findings of blood chemistry: Secondary | ICD-10-CM

## 2022-05-08 NOTE — Telephone Encounter (Signed)
Copied from [place] [id number]. Topic: Referral - Request for Referral ?>> May 07, 2022  4:35 PM Alanda Slim E wrote: ?Has patient seen PCP for this complaint? Yes ? ?*If NO, is insurance requiring patient see PCP for this issue before PCP can refer them? ?Referral for which specialty: urology ?Preferred provider/office:  ?Reason for referral: for low testosterone / pt stated he has been waiting on this referral / please call pt as soon as the referral has been placed ?

## 2022-05-09 NOTE — Addendum Note (Signed)
Addended by: Delsa Grana on: 05/09/2022 06:21 PM ? ? Modules accepted: Orders ? ?

## 2022-05-11 ENCOUNTER — Ambulatory Visit: Payer: Commercial Managed Care - PPO | Admitting: Urology

## 2022-05-11 ENCOUNTER — Encounter: Payer: Self-pay | Admitting: Urology

## 2022-05-11 VITALS — BP 143/80 | HR 96 | Ht 71.0 in | Wt 253.0 lb

## 2022-05-11 DIAGNOSIS — E291 Testicular hypofunction: Secondary | ICD-10-CM

## 2022-05-11 MED ORDER — CLOMIPHENE CITRATE 50 MG PO TABS: 25.0000 mg | ORAL_TABLET | Freq: Every day | ORAL | 0 refills | Status: DC

## 2022-05-11 NOTE — Progress Notes (Signed)
? ?05/11/2022 ?10:00 AM  ? ?Martin Parks ?09-19-1975 ?[id number] ? ?Referring provider: Delsa Grana, PA-C ?[address] ?[address] ?[place],  [place] [id number] ? ?Chief Complaint  ?Patient presents with  ? Hypogonadism  ? ? ?HPI: ?Martin Parks is a 47 y.o. male referred for evaluation of hypogonadism. ? ?Saw PCP March 2023 complaining of tenderness right breast ?Testosterone level was 63 ng/dL ?Repeat testosterone level 04/09/2022 140 ng/dL; LH low normal 2.2; prolactin normal 3.9 and estradiol normal 13 ?He is primarily interested in the reason his testosterone level is low and is also awaiting an appointment for endocrinology ?Has noted increased weight gain since hernia surgery in 2020 ?Also has significant tiredness/fatigue, decreased libido and mild ED ?Has sleep apnea on CPAP ?No bothersome LUTS ?PSA 01/2022 0.4 ? ? ?PMH: ?Past Medical History:  ?Diagnosis Date  ? Anxiety   ? Complication of anesthesia   ? hard time waking up after hernia surgery  ? Depression   ? Dysplastic nevus   ? Back x 2, treated at Dr. Jenetta Downer office, will request pathology  ? GERD (gastroesophageal reflux disease)   ? h/o   ? Headache   ? migraines  ? History of kidney stones   ? currently  ? Hypertension   ? h/o years ago due to anxiety-taken off bp meds once anxiety meds started helping  ? Pneumonia   ? h/o   ? Umbilical hernia without obstruction and without gangrene 12/22/2018  ? ? ?Surgical History: ?Past Surgical History:  ?Procedure Laterality Date  ? COLONOSCOPY WITH PROPOFOL N/A 03/30/2022  ? Procedure: COLONOSCOPY WITH PROPOFOL;  Surgeon: Jonathon Bellows, MD;  Location: [facility] ENDOSCOPY;  Service: Gastroenterology;  Laterality: N/A;  ? HERNIA REPAIR    ? inguinal  ? TONSILLECTOMY    ? UMBILICAL HERNIA REPAIR N/A 03/02/2019  ? Procedure: OPEN HERNIA REPAIR UMBILICAL ADULT WITH MESH;  Surgeon: Vickie Epley, MD;  Location: [facility] ORS;  Service: General;  Laterality: N/A;  ? ? ?Home Medications:  ?Allergies as of 05/11/2022   ? ?    Reactions  ? Grass Extracts [gramineae Pollens]   ? Adhesive [tape] Rash  ? ?  ? ?  ?Medication List  ?  ? ?  ? Accurate as of May 11, 2022 10:00 AM. If you have any questions, ask your nurse or doctor.  ?  ?  ? ?  ? ?ALPRAZolam 0.5 MG tablet ?Commonly known as: Duanne Moron ?Take 0.5 mg by mouth 2 (two) times daily as needed for anxiety. ?  ?b complex vitamins capsule ?Take 1 capsule by mouth daily. ?  ?busPIRone 5 MG tablet ?Commonly known as: BUSPAR ?Take 1-3 tablets (5-15 mg total) by mouth 2 (two) times daily as needed (anxiety). ?  ?cholecalciferol 25 MCG (1000 UNIT) tablet ?Commonly known as: VITAMIN D3 ?Take 5,000 Units by mouth daily. ?  ?desvenlafaxine 100 MG 24 hr tablet ?Commonly known as: PRISTIQ ?Take 100 mg by mouth every morning. ?  ?Fish Oil 1000 MG Caps ?Take 1,000 mg by mouth daily. ?  ?FLUoxetine 20 MG capsule ?Commonly known as: PROZAC ?Take 20 mg by mouth daily. ?  ?fluticasone 50 MCG/ACT nasal spray ?Commonly known as: FLONASE ?SHAKE WELL AND USE 2 SPRAYS IN EACH NOSTRIL DAILY ?What changed: See the new instructions. ?  ?hydrocortisone 2.5 % lotion ?Apply topically 3 (three) times a week. Apply to rash on chin 3 nights weekly, Tuesday, Thursday and Saturday ?  ?hydrOXYzine 25 MG tablet ?Commonly known as: ATARAX ?Take 1 tablet (25  mg total) by mouth 3 (three) times daily as needed. ?  ?ketoconazole 2 % cream ?Commonly known as: NIZORAL ?Apply topically. ?  ?loratadine 10 MG tablet ?Commonly known as: CLARITIN ?Take by mouth. ?  ?Osteo Bi-Flex Adv Triple St Tabs ?Take 1 tablet by mouth daily. ?  ?Turmeric 500 MG Caps ?Take 500 mg by mouth daily. ?  ? ?  ? ? ?Allergies:  ?Allergies  ?Allergen Reactions  ? Grass Extracts [Gramineae Pollens]   ? Adhesive [Tape] Rash  ? ? ?Family History: ?Family History  ?Problem Relation Age of Onset  ? Cancer Sister   ?     breast  ? ? ?Social History:  reports that he has never smoked. He has never used smokeless tobacco. He reports current alcohol use. He reports  that he does not use drugs. ? ? ?Physical Exam: ?BP (!) 143/80   Pulse 96   Ht 5' 11"  (1.803 m)   Wt 253 lb (114.8 kg)   BMI 35.29 kg/m?   ?Constitutional:  Alert and oriented, No acute distress. ?HEENT: [place] AT, moist mucus membranes.  Trachea midline, no masses. ?Cardiovascular: No clubbing, cyanosis, or edema. ?Respiratory: Normal respiratory effort, no increased work of breathing. ?GI: Abdomen is soft, nontender, nondistended, no abdominal masses ?GU: Phallus without lesions.  Testes descended bilaterally without masses or tenderness.  Volume >20 cc bilaterally ?Skin: No rashes, bruises or suspicious lesions. ?Neurologic: Grossly intact, no focal deficits, moving all 4 extremities. ?Psychiatric: Normal mood and affect. ? ? ?Assessment & Plan:   ? ?1.  Hypogonadism ?Bothersome symptoms with significant low T ?LH low normal ?We discussed the diagnosis of testosterone is based on 2 abnormal total testosterone levels drawn in the a.m. admitted with signs and symptoms of low testosterone. ?We discussed various forms of testosterone placement including topical preparations, intramuscular injections, subcutaneous injections, subcutaneous pellet implantation and oral testosterone.  Pros and cons of each form were discussed.  The risk of transference of topical testosterone was discussed.  The off label use of Clomid was also discussed in patients with mid range to low normal LH ?I had a discussion regarding testosterone replacement therapy including the following: Treatment may result in improvements in erectile function, low sex drive, anemia, bone mineral density, lean body mass, and depressive symptoms; evidence is inconclusive whether testosterone therapy improves cognitive function, measures of diabetes, energy, fatigue, lipid profiles, and quality of life measures; there is no conclusive evidence linking testosterone therapy to the development of prostate cancer; there is no definitive evidence linking  testosterone therapy to a higher incidence of venothrombolic events; at the present time it cannot be stated definitively whether testosterone therapy increases or decreases the risk of cardiovascular events including myocardial infarction and stroke. ?Potential side effects were discussed including erythrocytosis, gynecomastia.  The need for regular monitoring of testosterone levels and hematocrit was discussed. ?He has also requested an endocrinology for further evaluation of the etiology of his low testosterone and will defer pituitary MRI to endocrinology with low normal LH levels ?He would initially like to try Clomid and Rx was sent to pharmacy ?Recheck testosterone level 4 weeks ? ? ? ?Abbie Sons, MD ? ?[facility] ?[address] ?[place], [place] [address] ?[phone] ? ?

## 2022-06-14 ENCOUNTER — Other Ambulatory Visit: Payer: Commercial Managed Care - PPO

## 2022-06-18 ENCOUNTER — Other Ambulatory Visit: Payer: Commercial Managed Care - PPO

## 2022-06-18 DIAGNOSIS — E291 Testicular hypofunction: Secondary | ICD-10-CM

## 2022-06-20 LAB — TESTOSTERONE: Testosterone: 510 ng/dL (ref 264–916)

## 2022-06-21 ENCOUNTER — Telehealth: Payer: Self-pay | Admitting: Urology

## 2022-06-21 NOTE — Telephone Encounter (Signed)
Testosterone level significantly improved on Clomid and was 510.  Please see if his symptoms of tiredness, fatigue and decreased sex drive have improved.

## 2022-06-21 NOTE — Telephone Encounter (Signed)
Patient called back. He states his symptoms have slightly gotten better. He would like a refill on Clomid.

## 2022-06-22 MED ORDER — CLOMIPHENE CITRATE 50 MG PO TABS: 25.0000 mg | ORAL_TABLET | Freq: Every day | ORAL | 1 refills | Status: DC

## 2022-08-01 ENCOUNTER — Ambulatory Visit: Payer: BC Managed Care – PPO | Admitting: Dermatology

## 2022-08-01 ENCOUNTER — Encounter: Payer: Self-pay | Admitting: Dermatology

## 2022-08-01 DIAGNOSIS — L578 Other skin changes due to chronic exposure to nonionizing radiation: Secondary | ICD-10-CM

## 2022-08-01 DIAGNOSIS — D229 Melanocytic nevi, unspecified: Secondary | ICD-10-CM

## 2022-08-01 DIAGNOSIS — L918 Other hypertrophic disorders of the skin: Secondary | ICD-10-CM

## 2022-08-01 DIAGNOSIS — Z1283 Encounter for screening for malignant neoplasm of skin: Secondary | ICD-10-CM | POA: Diagnosis not present

## 2022-08-01 DIAGNOSIS — L2089 Other atopic dermatitis: Secondary | ICD-10-CM

## 2022-08-01 DIAGNOSIS — Z86018 Personal history of other benign neoplasm: Secondary | ICD-10-CM

## 2022-08-01 DIAGNOSIS — D2372 Other benign neoplasm of skin of left lower limb, including hip: Secondary | ICD-10-CM | POA: Diagnosis not present

## 2022-08-01 DIAGNOSIS — D239 Other benign neoplasm of skin, unspecified: Secondary | ICD-10-CM

## 2022-08-01 DIAGNOSIS — L820 Inflamed seborrheic keratosis: Secondary | ICD-10-CM

## 2022-08-01 DIAGNOSIS — D1800 Hemangioma unspecified site: Secondary | ICD-10-CM

## 2022-08-01 DIAGNOSIS — L814 Other melanin hyperpigmentation: Secondary | ICD-10-CM

## 2022-08-01 DIAGNOSIS — L821 Other seborrheic keratosis: Secondary | ICD-10-CM

## 2022-08-01 MED ORDER — MOMETASONE FUROATE 0.1 % EX CREA: 1.0000 | TOPICAL_CREAM | Freq: Every day | CUTANEOUS | 1 refills | Status: AC | PRN

## 2022-08-01 NOTE — Progress Notes (Signed)
Follow-Up Visit   Subjective  Martin Parks is a 47 y.o. male who presents for the following: Annual Exam (History of dysplastic nevi - The patient presents for Total-Body Skin Exam (TBSE) for skin cancer screening and mole check.  The patient has spots, moles and lesions to be evaluated, some may be new or changing and the patient has concerns that these could be cancer./).  The following portions of the chart were reviewed this encounter and updated as appropriate:   Tobacco  Allergies  Meds  Problems  Med Hx  Surg Hx  Fam Hx     Review of Systems:  No other skin or systemic complaints except as noted in HPI or Assessment and Plan.  Objective  Well appearing patient in no apparent distress; mood and affect are within normal limits.  A full examination was performed including scalp, head, eyes, ears, nose, lips, neck, chest, axillae, abdomen, back, buttocks, bilateral upper extremities, bilateral lower extremities, hands, feet, fingers, toes, fingernails, and toenails. All findings within normal limits unless otherwise noted below.  Right Forehead Erythematous stuck-on, waxy papule or plaque  Right Upper Back Minimal pink patch  Left thigh Firm pink/brown papulenodule with dimple sign.    Assessment & Plan   Acrochordons (Skin Tags) - Fleshy, skin-colored pedunculated papules - Benign appearing.  - Observe. - If desired, they can be removed with an in office procedure that is not covered by insurance. - Please call the clinic if you notice any new or changing lesions.  History of Dysplastic Nevi - No evidence of recurrence today - Recommend regular full body skin exams - Recommend daily broad spectrum sunscreen SPF 30+ to sun-exposed areas, reapply every 2 hours as needed.  - Call if any new or changing lesions are noted between office visits  Lentigines - Scattered tan macules - Due to sun exposure - Benign-appearing, observe - Recommend daily broad  spectrum sunscreen SPF 30+ to sun-exposed areas, reapply every 2 hours as needed. - Call for any changes  Seborrheic Keratoses - Stuck-on, waxy, tan-brown papules and/or plaques  - Benign-appearing - Discussed benign etiology and prognosis. - Observe - Call for any changes  Melanocytic Nevi - Tan-brown and/or pink-flesh-colored symmetric macules and papules - Benign appearing on exam today - Observation - Call clinic for new or changing moles - Recommend daily use of broad spectrum spf 30+ sunscreen to sun-exposed areas.   Hemangiomas - Red papules - Discussed benign nature - Observe - Call for any changes  Actinic Damage - Chronic condition, secondary to cumulative UV/sun exposure - diffuse scaly erythematous macules with underlying dyspigmentation - Recommend daily broad spectrum sunscreen SPF 30+ to sun-exposed areas, reapply every 2 hours as needed.  - Staying in the shade or wearing long sleeves, sun glasses (UVA+UVB protection) and wide brim hats (4-inch brim around the entire circumference of the hat) are also recommended for sun protection.  - Call for new or changing lesions.  Skin cancer screening performed today.  Inflamed seborrheic keratosis  Forehead Symptomatic, irritating, patient would like treated. Destruction of lesion -  Forehead Complexity: simple   Destruction method: cryotherapy   Informed consent: discussed and consent obtained   Timeout:  patient name, date of birth, surgical site, and procedure verified Lesion destroyed using liquid nitrogen: Yes   Region frozen until ice ball extended beyond lesion: Yes   Outcome: patient tolerated procedure well with no complications   Post-procedure details: wound care instructions given    Other atopic dermatitis  Right Upper Back Atopic Dermatitis vs Irritant Contact Dermatitis  Start Mometasone cream qd up to 5 times per week until clear  mometasone (ELOCON) 0.1 % cream - Right Upper Back Apply 1  Application topically daily as needed (Rash).  Dermatofibroma Left thigh Benign, observe.   Return in about 1 year (around 08/02/2023) for TBSE.  I, Ashok Cordia, CMA, am acting as scribe for Sarina Ser, MD . Documentation: I have reviewed the above documentation for accuracy and completeness, and I agree with the above.  Sarina Ser, MD

## 2022-08-01 NOTE — Patient Instructions (Signed)
Cryotherapy Aftercare  Wash gently with soap and water everyday.   Apply Vaseline and Band-Aid daily until healed.     Due to recent changes in healthcare laws, you may see results of your pathology and/or laboratory studies on MyChart before the doctors have had a chance to review them. We understand that in some cases there may be results that are confusing or concerning to you. Please understand that not all results are received at the same time and often the doctors may need to interpret multiple results in order to provide you with the best plan of care or course of treatment. Therefore, we ask that you please give us 2 business days to thoroughly review all your results before contacting the office for clarification. Should we see a critical lab result, you will be contacted sooner.   If You Need Anything After Your Visit  If you have any questions or concerns for your doctor, please call our main line at [phone] and press option 4 to reach your doctor's medical assistant. If no one answers, please leave a voicemail as directed and we will return your call as soon as possible. Messages left after 4 pm will be answered the following business day.   You may also send us a message via MyChart. We typically respond to MyChart messages within 1-2 business days.  For prescription refills, please ask your pharmacy to contact our office. Our fax number is [phone].  If you have an urgent issue when the clinic is closed that cannot wait until the next business day, you can page your doctor at the number below.    Please note that while we do our best to be available for urgent issues outside of office hours, we are not available 24/7.   If you have an urgent issue and are unable to reach us, you may choose to seek medical care at your doctor's office, retail clinic, urgent care center, or emergency room.  If you have a medical emergency, please immediately call 911 or go to the  emergency department.  Pager Numbers  - Dr. Kowalski: [phone]  - Dr. Moye: [phone]  - Dr. Stewart: [phone]  In the event of inclement weather, please call our main line at [phone] for an update on the status of any delays or closures.  Dermatology Medication Tips: Please keep the boxes that topical medications come in in order to help keep track of the instructions about where and how to use these. Pharmacies typically print the medication instructions only on the boxes and not directly on the medication tubes.   If your medication is too expensive, please contact our office at [phone] option 4 or send us a message through MyChart.   We are unable to tell what your co-pay for medications will be in advance as this is different depending on your insurance coverage. However, we may be able to find a substitute medication at lower cost or fill out paperwork to get insurance to cover a needed medication.   If a prior authorization is required to get your medication covered by your insurance company, please allow us 1-2 business days to complete this process.  Drug prices often vary depending on where the prescription is filled and some pharmacies may offer cheaper prices.  The website www.goodrx.com contains coupons for medications through different pharmacies. The prices here do not account for what the cost may be with help from insurance (it may be cheaper with your insurance), but the website can   give you the price if you did not use any insurance.  - You can print the associated coupon and take it with your prescription to the pharmacy.  - You may also stop by our office during regular business hours and pick up a GoodRx coupon card.  - If you need your prescription sent electronically to a different pharmacy, notify our office through [facility] MyChart or by phone at [phone] option 4.     Si Usted Necesita Algo Despus de Su Visita  Tambin puede  enviarnos un mensaje a travs de MyChart. Por lo general respondemos a los mensajes de MyChart en el transcurso de 1 a 2 das hbiles.  Para renovar recetas, por favor pida a su farmacia que se ponga en contacto con nuestra oficina. Nuestro nmero de fax es el [phone].  Si tiene un asunto urgente cuando la clnica est cerrada y que no puede esperar hasta el siguiente da hbil, puede llamar/localizar a su doctor(a) al nmero que aparece a continuacin.   Por favor, tenga en cuenta que aunque hacemos todo lo posible para estar disponibles para asuntos urgentes fuera del horario de oficina, no estamos disponibles las 24 horas del da, los 7 das de la semana.   Si tiene un problema urgente y no puede comunicarse con nosotros, puede optar por buscar atencin mdica  en el consultorio de su doctor(a), en una clnica privada, en un centro de atencin urgente o en una sala de emergencias.  Si tiene una emergencia mdica, por favor llame inmediatamente al 911 o vaya a la sala de emergencias.  Nmeros de bper  - Dr. Kowalski: [phone]  - Dra. Moye: [phone]  - Dra. Stewart: [phone]  En caso de inclemencias del tiempo, por favor llame a nuestra lnea principal al [phone] para una actualizacin sobre el estado de cualquier retraso o cierre.  Consejos para la medicacin en dermatologa: Por favor, guarde las cajas en las que vienen los medicamentos de uso tpico para ayudarle a seguir las instrucciones sobre dnde y cmo usarlos. Las farmacias generalmente imprimen las instrucciones del medicamento slo en las cajas y no directamente en los tubos del medicamento.   Si su medicamento es muy caro, por favor, pngase en contacto con nuestra oficina llamando al [phone] y presione la opcin 4 o envenos un mensaje a travs de MyChart.   No podemos decirle cul ser su copago por los medicamentos por adelantado ya que esto es diferente dependiendo de la cobertura de su seguro.  Sin embargo, es posible que podamos encontrar un medicamento sustituto a menor costo o llenar un formulario para que el seguro cubra el medicamento que se considera necesario.   Si se requiere una autorizacin previa para que su compaa de seguros cubra su medicamento, por favor permtanos de 1 a 2 das hbiles para completar este proceso.  Los precios de los medicamentos varan con frecuencia dependiendo del lugar de dnde se surte la receta y alguna farmacias pueden ofrecer precios ms baratos.  El sitio web www.goodrx.com tiene cupones para medicamentos de diferentes farmacias. Los precios aqu no tienen en cuenta lo que podra costar con la ayuda del seguro (puede ser ms barato con su seguro), pero el sitio web puede darle el precio si no utiliz ningn seguro.  - Puede imprimir el cupn correspondiente y llevarlo con su receta a la farmacia.  - Tambin puede pasar por nuestra oficina durante el horario de atencin regular y recoger una tarjeta de cupones de GoodRx.  -   Si necesita que su receta se enve electrnicamente a una farmacia diferente, informe a nuestra oficina a travs de MyChart de [facility] o por telfono llamando al [phone] y presione la opcin 4.  

## 2022-08-15 ENCOUNTER — Ambulatory Visit: Payer: Commercial Managed Care - PPO | Admitting: Physician Assistant

## 2022-09-20 ENCOUNTER — Other Ambulatory Visit: Payer: Self-pay | Admitting: *Deleted

## 2022-09-20 DIAGNOSIS — E291 Testicular hypofunction: Secondary | ICD-10-CM

## 2022-09-21 ENCOUNTER — Other Ambulatory Visit: Payer: BC Managed Care – PPO

## 2022-09-21 DIAGNOSIS — Z125 Encounter for screening for malignant neoplasm of prostate: Secondary | ICD-10-CM | POA: Diagnosis not present

## 2022-09-21 DIAGNOSIS — E291 Testicular hypofunction: Secondary | ICD-10-CM | POA: Diagnosis not present

## 2022-09-23 LAB — HEMOGLOBIN AND HEMATOCRIT, BLOOD
Hematocrit: 44.3 % (ref 37.5–51.0)
Hemoglobin: 15 g/dL (ref 13.0–17.7)

## 2022-09-23 LAB — PSA: Prostate Specific Ag, Serum: 0.7 ng/mL (ref 0.0–4.0)

## 2022-09-23 LAB — TESTOSTERONE: Testosterone: 395 ng/dL (ref 264–916)

## 2022-09-24 DIAGNOSIS — G4733 Obstructive sleep apnea (adult) (pediatric): Secondary | ICD-10-CM | POA: Diagnosis not present

## 2022-09-26 ENCOUNTER — Ambulatory Visit: Payer: BC Managed Care – PPO | Admitting: Urology

## 2022-10-03 ENCOUNTER — Encounter: Payer: Self-pay | Admitting: Urology

## 2022-10-03 ENCOUNTER — Ambulatory Visit: Payer: BC Managed Care – PPO | Admitting: Urology

## 2022-10-03 VITALS — BP 140/82 | HR 91 | Ht 71.0 in | Wt 250.0 lb

## 2022-10-03 DIAGNOSIS — E291 Testicular hypofunction: Secondary | ICD-10-CM

## 2022-10-03 NOTE — Progress Notes (Signed)
10/03/2022 8:00 AM   Martin Parks 08/18/1975 [id number]  Referring provider: Delsa Grana, PA-C [address] [place],  [place] [address]  Chief Complaint  Patient presents with   Hypogonadism    HPI: 47 y.o. male presents for follow-up of hypogonadism.  Initially seen 05/11/2022 with testosterone level 140 and complaints of tiredness/fatigue, decreased libido, mild ED and left breast tenderness.  LH level was low normal and he elected a trial of Clomid Has noted symptom improvement on Clomid.  His initial testosterone level had improved to 510.  He also quit his high stress job and is exercising and has lost weight.  His PCP put in an endocrinology referral at his request and he is waiting for an appointment No bothersome LUTS Labs 09/21/2022: Testosterone 395 ng/dL; PSA 0.7; H/H 15/44.3   PMH: Past Medical History:  Diagnosis Date   Anxiety    Complication of anesthesia    hard time waking up after hernia surgery   Depression    Dysplastic nevus    Back x 2, treated at Dr. Jenetta Downer office, will request pathology   GERD (gastroesophageal reflux disease)    h/o    Headache    migraines   History of kidney stones    currently   Hypertension    h/o years ago due to anxiety-taken off bp meds once anxiety meds started helping   Pneumonia    h/o    Umbilical hernia without obstruction and without gangrene 12/22/2018    Surgical History: Past Surgical History:  Procedure Laterality Date   COLONOSCOPY WITH PROPOFOL N/A 03/30/2022   Procedure: COLONOSCOPY WITH PROPOFOL;  Surgeon: Jonathon Bellows, MD;  [facility] ENDOSCOPY;  Service: Gastroenterology;  Laterality: N/A;   HERNIA REPAIR     inguinal   TONSILLECTOMY     UMBILICAL HERNIA REPAIR N/A 03/02/2019   Procedure: OPEN HERNIA REPAIR UMBILICAL ADULT WITH MESH;  Surgeon: Vickie Epley, MD;  Location: [facility] ORS;  Service: General;  Laterality: N/A;    Home Medications:  Allergies as of 10/03/2022        Reactions   Grass Extracts [gramineae Pollens]    Adhesive [tape] Rash        Medication List        Accurate as of October 03, 2022  8:00 AM. If you have any questions, ask your nurse or doctor.          ALPRAZolam 0.5 MG tablet Commonly known as: XANAX Take 0.5 mg by mouth 2 (two) times daily as needed for anxiety.   b complex vitamins capsule Take 1 capsule by mouth daily.   busPIRone 5 MG tablet Commonly known as: BUSPAR Take 1-3 tablets (5-15 mg total) by mouth 2 (two) times daily as needed (anxiety).   cholecalciferol 25 MCG (1000 UNIT) tablet Commonly known as: VITAMIN D3 Take 5,000 Units by mouth daily.   clomiPHENE 50 MG tablet Commonly known as: CLOMID Take 0.5 tablets (25 mg total) by mouth daily.   desvenlafaxine 100 MG 24 hr tablet Commonly known as: PRISTIQ Take 100 mg by mouth every morning.   Fish Oil 1000 MG Caps Take 1,000 mg by mouth daily.   FLUoxetine 20 MG capsule Commonly known as: PROZAC Take 20 mg by mouth daily.   fluticasone 50 MCG/ACT nasal spray Commonly known as: FLONASE SHAKE WELL AND USE 2 SPRAYS IN EACH NOSTRIL DAILY What changed: See the new instructions.   hydrocortisone 2.5 % lotion Apply topically 3 (three) times a  week. Apply to rash on chin 3 nights weekly, Tuesday, Thursday and Saturday   hydrOXYzine 25 MG tablet Commonly known as: ATARAX Take 1 tablet (25 mg total) by mouth 3 (three) times daily as needed.   ketoconazole 2 % cream Commonly known as: NIZORAL Apply topically.   loratadine 10 MG tablet Commonly known as: CLARITIN Take by mouth.   mometasone 0.1 % cream Commonly known as: ELOCON Apply 1 Application topically daily as needed (Rash).   Osteo Bi-Flex Adv Triple St Tabs Take 1 tablet by mouth daily.   Turmeric 500 MG Caps Take 500 mg by mouth daily.        Allergies:  Allergies  Allergen Reactions   Grass Extracts [Gramineae Pollens]    Adhesive [Tape] Rash    Family  History: Family History  Problem Relation Age of Onset   Cancer Sister        breast    Social History:  reports that he has never smoked. He has never used smokeless tobacco. He reports current alcohol use. He reports that he does not use drugs.   Physical Exam: BP (!) 140/82   Pulse 91   Ht '5\' 11"'$  (1.803 m)   Wt 250 lb (113.4 kg)   BMI 34.87 kg/m   Constitutional:  Alert and oriented, No acute distress. HEENT: [place] AT Respiratory: Normal respiratory effort, no increased work of breathing. Psychiatric: Normal mood and affect.   Assessment & Plan:    1.  Hypogonadism Doing well on Clomid.  He inquired if his testosterone levels would return to abnormal levels if he elected to discontinue.  We discussed this is a possibility though I have had patients whose testosterone levels have continued to be normal with cessation of the medication.  He would like to briefly stop the Clomid and will recheck a testosterone level in 2 months.  Instructed to call earlier should he experience increasing symptoms after stopping the medication   Abbie Sons, MD  [facility] [address], [place], [place] [address] [phone]

## 2022-10-24 DIAGNOSIS — G4733 Obstructive sleep apnea (adult) (pediatric): Secondary | ICD-10-CM | POA: Diagnosis not present

## 2022-11-24 DIAGNOSIS — G4733 Obstructive sleep apnea (adult) (pediatric): Secondary | ICD-10-CM | POA: Diagnosis not present

## 2022-12-04 ENCOUNTER — Other Ambulatory Visit: Payer: BC Managed Care – PPO

## 2022-12-04 DIAGNOSIS — E291 Testicular hypofunction: Secondary | ICD-10-CM

## 2022-12-05 LAB — TESTOSTERONE: Testosterone: 252 ng/dL — ABNORMAL LOW (ref 264–916)

## 2022-12-07 ENCOUNTER — Other Ambulatory Visit: Payer: Self-pay | Admitting: *Deleted

## 2022-12-07 ENCOUNTER — Encounter: Payer: Self-pay | Admitting: Urology

## 2022-12-07 MED ORDER — CLOMIPHENE CITRATE 50 MG PO TABS: 25.0000 mg | ORAL_TABLET | Freq: Every day | ORAL | 1 refills | Status: DC

## 2022-12-25 DIAGNOSIS — G4733 Obstructive sleep apnea (adult) (pediatric): Secondary | ICD-10-CM | POA: Diagnosis not present

## 2023-01-07 ENCOUNTER — Telehealth: Payer: Self-pay | Admitting: Family Medicine

## 2023-01-07 DIAGNOSIS — R7989 Other specified abnormal findings of blood chemistry: Secondary | ICD-10-CM

## 2023-01-07 DIAGNOSIS — E291 Testicular hypofunction: Secondary | ICD-10-CM

## 2023-01-07 NOTE — Telephone Encounter (Signed)
Copied from [place] [id number]. Topic: Referral - Question >> Jan 07, 2023  2:28 PM Oley Balm A wrote: Reason for CRM: Patient is requesting an referral to Endocrinology at [facility] [phone] or with [facility] in [place].

## 2023-01-07 NOTE — Telephone Encounter (Signed)
done

## 2023-01-07 NOTE — Addendum Note (Signed)
Addended by: Delsa Grana on: 01/07/2023 06:23 PM   Modules accepted: Orders

## 2023-01-08 NOTE — Telephone Encounter (Signed)
Pt notified- verbalized understanding.

## 2023-01-21 DIAGNOSIS — G4733 Obstructive sleep apnea (adult) (pediatric): Secondary | ICD-10-CM | POA: Diagnosis not present

## 2023-01-21 DIAGNOSIS — R7989 Other specified abnormal findings of blood chemistry: Secondary | ICD-10-CM | POA: Diagnosis not present

## 2023-01-21 DIAGNOSIS — E291 Testicular hypofunction: Secondary | ICD-10-CM | POA: Diagnosis not present

## 2023-01-25 DIAGNOSIS — G4733 Obstructive sleep apnea (adult) (pediatric): Secondary | ICD-10-CM | POA: Diagnosis not present

## 2023-02-15 NOTE — Patient Instructions (Signed)

## 2023-02-18 ENCOUNTER — Encounter: Payer: Self-pay | Admitting: Family Medicine

## 2023-02-18 ENCOUNTER — Ambulatory Visit (INDEPENDENT_AMBULATORY_CARE_PROVIDER_SITE_OTHER): Payer: BC Managed Care – PPO | Admitting: Family Medicine

## 2023-02-18 VITALS — BP 120/72 | HR 89 | Temp 98.4°F | Resp 16 | Ht 71.0 in | Wt 247.1 lb

## 2023-02-18 DIAGNOSIS — R7303 Prediabetes: Secondary | ICD-10-CM | POA: Diagnosis not present

## 2023-02-18 DIAGNOSIS — E785 Hyperlipidemia, unspecified: Secondary | ICD-10-CM

## 2023-02-18 DIAGNOSIS — R7989 Other specified abnormal findings of blood chemistry: Secondary | ICD-10-CM | POA: Diagnosis not present

## 2023-02-18 DIAGNOSIS — N62 Hypertrophy of breast: Secondary | ICD-10-CM

## 2023-02-18 DIAGNOSIS — G479 Sleep disorder, unspecified: Secondary | ICD-10-CM

## 2023-02-18 DIAGNOSIS — G4733 Obstructive sleep apnea (adult) (pediatric): Secondary | ICD-10-CM

## 2023-02-18 DIAGNOSIS — Z0000 Encounter for general adult medical examination without abnormal findings: Secondary | ICD-10-CM | POA: Diagnosis not present

## 2023-02-18 DIAGNOSIS — R5383 Other fatigue: Secondary | ICD-10-CM

## 2023-02-18 DIAGNOSIS — F411 Generalized anxiety disorder: Secondary | ICD-10-CM

## 2023-02-18 DIAGNOSIS — R002 Palpitations: Secondary | ICD-10-CM

## 2023-02-18 DIAGNOSIS — R4586 Emotional lability: Secondary | ICD-10-CM

## 2023-02-18 NOTE — Progress Notes (Signed)
Patient: Martin Parks, Male    DOB: 1975/03/10, 48 y.o.   MRN: [id number] Delsa Grana, PA-C Visit Date: 02/18/2023  Today's Provider: Delsa Grana, PA-C   Chief Complaint  Patient presents with   Annual Exam   Subjective:   Annual physical exam:  Martin Parks is a 48 y.o. male who presents today for health maintenance and annual & complete physical exam.   Working on eating healthy, exercising, using CPAP, doing his follow up with specialists  Weight down Having some continued sx palpitations, sweats nighttime waking Anxiety - he's been managing it   SDOH Screenings   Food Insecurity: No Food Insecurity (02/18/2023)  Housing: Low Risk  (02/18/2023)  Transportation Needs: No Transportation Needs (02/18/2023)  Utilities: Not At Risk (02/18/2023)  Alcohol Screen: Low Risk  (02/09/2020)  Depression (PHQ2-9): Low Risk  (02/18/2023)  Financial Resource Strain: Low Risk  (02/18/2023)  Physical Activity: Sufficiently Active (02/18/2023)  Social Connections: Socially Integrated (02/18/2023)  Stress: Stress Concern Present (02/18/2023)  Tobacco Use: Low Risk  (02/18/2023)     USPSTF grade A and B recommendations - reviewed and addressed today  Depression:  Phq 9 completed today by patient, was reviewed by me with patient in the room, score is  negative, pt feels good    02/18/2023    8:49 AM 04/13/2022    3:51 PM 03/29/2022    8:36 AM  Depression screen PHQ 2/9  Decreased Interest 0 0 0  Down, Depressed, Hopeless 0 0 0  PHQ - 2 Score 0 0 0  Altered sleeping 0 0 0  Tired, decreased energy 0 0 0  Change in appetite 0 0 0  Feeling bad or failure about yourself  0 0 0  Trouble concentrating 0 0 0  Moving slowly or fidgety/restless 0 0 0  Suicidal thoughts 0 0 0  PHQ-9 Score 0 0 0  Difficult doing work/chores Not difficult at all Not difficult at all Not difficult at all    Hep C Screening: done  STD testing and prevention (HIV/chl/gon/syphilis): done  Intimate  partner violence:  feels safe  Advanced Care Planning:  A voluntary discussion about advance care planning including the explanation and discussion of advance directives.  Discussed health care proxy and Living will, and the patient was able to identify a health care proxy as wife, .  Patient does not have a living will at present time. If patient does have living will, I have requested they bring this to the clinic to be scanned in to their chart.  Health Maintenance  Topic Date Due   DTaP/Tdap/Td (2 - Td or Tdap) 01/07/2022   INFLUENZA VACCINE  03/31/2023 (Originally 07/31/2022)   COLONOSCOPY (Pts 45-40yr Insurance coverage will need to be confirmed)  03/30/2025   Hepatitis C Screening  Completed   HIV Screening  Completed   HPV VACCINES  Aged Out   COVID-19 Vaccine  Discontinued    Skin cancer:  Goes yearly for skin survey.  Pt reports no hx of skin cancer, prior mole suspicious lesions/biopsies in the past - [facility] Colorectal cancer:  colonoscopy is UTD, 3 year follow  Pt denies blood in stool change in bowels  Prostate cancer:  Prostate cancer screening with PSA: Discussed risks and benefits of PSA testing and provided handout. Pt declines to have PSA drawn today. Lab Results  Component Value Date   PSA 0.40 02/14/2022   PSA 0.40 02/09/2021   PSA 0.4 09/25/2018   Urinary Symptoms:  none  Lung cancer:   Low Dose CT Chest recommended if Age 80-80 years, 20 pack-year currently smoking OR have quit w/in 15years. Patient does not qualify.   Social History   Tobacco Use   Smoking status: Never   Smokeless tobacco: Never  Substance Use Topics   Alcohol use: Not Currently    Comment: rare     Alcohol screening: [place] Office Visit from 02/09/2020 in [facility]  AUDIT-C Score 1       AAA:  The USPSTF recommends one-time screening with ultrasonography in men ages 67 to 4 years who have ever smoked  ECG:  none indicated  Blood  pressure/Hypertension: BP Readings from Last 3 Encounters:  02/18/23 120/72  10/03/22 (!) 140/82  05/11/22 (!) 143/80   Weight/Obesity: Wt Readings from Last 3 Encounters:  02/18/23 247 lb 1.6 oz (112.1 kg)  10/03/22 250 lb (113.4 kg)  05/11/22 253 lb (114.8 kg)   BMI Readings from Last 3 Encounters:  02/18/23 34.46 kg/m  10/03/22 34.87 kg/m  05/11/22 35.29 kg/m    Lipids:  Lab Results  Component Value Date   CHOL 215 (H) 02/14/2022   CHOL 234 (H) 02/09/2021   CHOL 251 (H) 02/09/2020   Lab Results  Component Value Date   HDL 40 02/14/2022   HDL 40 02/09/2021   HDL 45 02/09/2020   Lab Results  Component Value Date   LDLCALC 146 (H) 02/14/2022   LDLCALC 157 (H) 02/09/2021   LDLCALC 171 (H) 02/09/2020   Lab Results  Component Value Date   TRIG 155 (H) 02/14/2022   TRIG 205 (H) 02/09/2021   TRIG 195 (H) 02/09/2020   Lab Results  Component Value Date   CHOLHDL 5.4 (H) 02/14/2022   CHOLHDL 5.9 (H) 02/09/2021   CHOLHDL 5.6 (H) 02/09/2020   No results found for: "LDLDIRECT" Based on the results of lipid panel his/her cardiovascular risk factor ( using [place] )  in the next 10 years is : The 10-year ASCVD risk score (Arnett DK, et al., 2019) is: 3.3%   Values used to calculate the score:     Age: 89 years     Sex: Male     Is Non-Hispanic African American: No     Diabetic: No     Tobacco smoker: No     Systolic Blood Pressure: 123456 mmHg     Is BP treated: No     HDL Cholesterol: 40 mg/dL     Total Cholesterol: 215 mg/dL  Glucose:  Glucose  Date Value Ref Range Status  01/27/2013 128 (H) 65 - 99 mg/dL Final   Glucose, Bld  Date Value Ref Range Status  02/14/2022 90 65 - 99 mg/dL Final    Comment:    .            Fasting reference interval .   02/09/2021 81 65 - 99 mg/dL Final    Comment:    .            Fasting reference interval .   02/09/2020 85 65 - 99 mg/dL Final    Comment:    .            Fasting reference interval .      Social History       Social History   Socioeconomic History   Marital status: Married    Spouse name: Willette Brace   Number of children: 2   Years of education: 102  Highest education level: Bachelor's degree (e.g., BA, AB, BS)  Occupational History   Not on file  Tobacco Use   Smoking status: Never   Smokeless tobacco: Never  Vaping Use   Vaping Use: Never used  Substance and Sexual Activity   Alcohol use: Not Currently    Comment: rare   Drug use: No   Sexual activity: Yes    Partners: Female  Other Topics Concern   Not on file  Social History Narrative   Not on file   Social Determinants of Health   Financial Resource Strain: Low Risk  (02/18/2023)   Overall Financial Resource Strain (CARDIA)    Difficulty of Paying Living Expenses: Not hard at all  Food Insecurity: No Food Insecurity (02/18/2023)   Hunger Vital Sign    Worried About Running Out of Food in the Last Year: Never true    [place] in the Last Year: Never true  Transportation Needs: No Transportation Needs (02/18/2023)   PRAPARE - Hydrologist (Medical): No    Lack of Transportation (Non-Medical): No  Physical Activity: Sufficiently Active (02/18/2023)   Exercise Vital Sign    Days of Exercise per Week: 3 days    Minutes of Exercise per Session: 60 min  Stress: Stress Concern Present (02/18/2023)   [place]    Feeling of Stress : Rather much  Social Connections: Socially Integrated (02/18/2023)   Social Connection and Isolation Panel [NHANES]    Frequency of Communication with Friends and Family: More than three times a week    Frequency of Social Gatherings with Friends and Family: More than three times a week    Attends Religious Services: 1 to 4 times per year    Active Member of Clubs or Organizations: No    Attends Music therapist: More than 4 times per year    Marital Status:  Married    Family History        Family History  Problem Relation Age of Onset   Cancer Sister        breast    Patient Active Problem List   Diagnosis Date Noted   Class 2 severe obesity with serious comorbidity and body mass index (BMI) of 35.0 to 35.9 in adult ([place]) 02/09/2021   OSA on CPAP 06/05/2020   Prediabetes 02/10/2020   Hyperactivity 12/22/2018   Generalized anxiety disorder 12/22/2018   Hyperlipidemia LDL goal <100 12/22/2018    Past Surgical History:  Procedure Laterality Date   COLONOSCOPY WITH PROPOFOL N/A 03/30/2022   Procedure: COLONOSCOPY WITH PROPOFOL;  Surgeon: Jonathon Bellows, MD;  Location[facility] ENDOSCOPY;  Service: Gastroenterology;  Laterality: N/A;   HERNIA REPAIR     inguinal   TONSILLECTOMY     UMBILICAL HERNIA REPAIR N/A 03/02/2019   Procedure: OPEN HERNIA REPAIR UMBILICAL ADULT WITH MESH;  Surgeon: Vickie Epley, MD;  Location: [facility] ORS;  Service: General;  Laterality: N/A;     Current Outpatient Medications:    ALPRAZolam (XANAX) 0.5 MG tablet, Take 0.5 mg by mouth 2 (two) times daily as needed for anxiety. , Disp: , Rfl: 5   b complex vitamins capsule, Take 1 capsule by mouth daily., Disp: , Rfl:    cholecalciferol (VITAMIN D3) 25 MCG (1000 UNIT) tablet, Take 5,000 Units by mouth daily., Disp: , Rfl:    clomiPHENE (CLOMID) 50 MG tablet, Take 0.5 tablets (25 mg total) by mouth daily.,  Disp: 30 tablet, Rfl: 1   fluticasone (FLONASE) 50 MCG/ACT nasal spray, SHAKE WELL AND USE 2 SPRAYS IN EACH NOSTRIL DAILY (Patient taking differently: Place 2 sprays into both nostrils daily.), Disp: 16 g, Rfl: 12   hydrocortisone 2.5 % lotion, Apply topically 3 (three) times a week. Apply to rash on chin 3 nights weekly, Tuesday, Thursday and Saturday, Disp: 59 mL, Rfl: 6   ketoconazole (NIZORAL) 2 % cream, Apply topically., Disp: , Rfl:    mometasone (ELOCON) 0.1 % cream, Apply 1 Application topically daily as needed (Rash)., Disp: 45 g, Rfl: 1   Omega-3 Fatty Acids  (FISH OIL) 1000 MG CAPS, Take 1,000 mg by mouth daily. , Disp: , Rfl:    Turmeric 500 MG CAPS, Take 500 mg by mouth daily. , Disp: , Rfl:    busPIRone (BUSPAR) 5 MG tablet, Take 1-3 tablets (5-15 mg total) by mouth 2 (two) times daily as needed (anxiety). (Patient not taking: Reported on 02/18/2023), Disp: 60 tablet, Rfl: 0   desvenlafaxine (PRISTIQ) 100 MG 24 hr tablet, Take 100 mg by mouth every morning.  (Patient not taking: Reported on 02/18/2023), Disp: , Rfl:    FLUoxetine (PROZAC) 20 MG capsule, Take 20 mg by mouth daily. (Patient not taking: Reported on 02/18/2023), Disp: , Rfl:    hydrOXYzine (ATARAX) 25 MG tablet, Take 1 tablet (25 mg total) by mouth 3 (three) times daily as needed. (Patient not taking: Reported on 02/18/2023), Disp: 30 tablet, Rfl: 0   loratadine (CLARITIN) 10 MG tablet, Take by mouth. (Patient not taking: Reported on 02/18/2023), Disp: , Rfl:    Misc Natural Products (OSTEO BI-FLEX ADV TRIPLE ST) TABS, Take 1 tablet by mouth daily. (Patient not taking: Reported on 02/18/2023), Disp: , Rfl:   Allergies  Allergen Reactions   Grass Extracts [Gramineae Pollens]    Adhesive [Tape] Rash    Patient Care Team: Delsa Grana, PA-C as PCP - General (Family Medicine)   Chart Review: I personally reviewed active problem list, medication list, allergies, family history, social history, health maintenance, notes from last encounter, lab results, imaging with the patient/caregiver today.   Review of Systems  Constitutional: Negative.   HENT: Negative.    Eyes: Negative.   Respiratory: Negative.    Cardiovascular: Negative.   Gastrointestinal: Negative.   Endocrine: Negative.   Genitourinary: Negative.   Musculoskeletal: Negative.   Skin: Negative.   Allergic/Immunologic: Negative.   Neurological: Negative.   Hematological: Negative.   Psychiatric/Behavioral: Negative.    All other systems reviewed and are negative.         Objective:   Vitals:  Vitals:   02/18/23  0854  BP: 120/72  Pulse: 89  Resp: 16  Temp: 98.4 F (36.9 C)  TempSrc: Oral  SpO2: 94%  Weight: 247 lb 1.6 oz (112.1 kg)  Height: 5' 11"$  (1.803 m)    Body mass index is 34.46 kg/m.  Physical Exam Vitals and nursing note reviewed.  Constitutional:      General: He is not in acute distress.    Appearance: Normal appearance. He is well-developed. He is obese. He is not ill-appearing, toxic-appearing or diaphoretic.     Interventions: Face mask in place.  HENT:     Head: Normocephalic and atraumatic.     Jaw: No trismus.     Right Ear: Tympanic membrane, ear canal and external ear normal.     Left Ear: Tympanic membrane, ear canal and external ear normal.     Nose: Congestion  present.     Mouth/Throat:     Mouth: Mucous membranes are moist.     Pharynx: Oropharynx is clear. No oropharyngeal exudate.  Eyes:     General: Lids are normal. No scleral icterus.       Right eye: No discharge.        Left eye: No discharge.     Conjunctiva/sclera: Conjunctivae normal.     Pupils: Pupils are equal, round, and reactive to light.  Neck:     Trachea: Trachea and phonation normal. No tracheal deviation.  Cardiovascular:     Rate and Rhythm: Normal rate and regular rhythm.     Pulses: Normal pulses.          Radial pulses are 2+ on the right side and 2+ on the left side.       Posterior tibial pulses are 2+ on the right side and 2+ on the left side.     Heart sounds: Normal heart sounds. No murmur heard.    No friction rub. No gallop.  Pulmonary:     Effort: Pulmonary effort is normal. No respiratory distress.     Breath sounds: Normal breath sounds. No stridor. No wheezing, rhonchi or rales.  Abdominal:     General: Bowel sounds are normal. There is no distension.     Palpations: Abdomen is soft.  Musculoskeletal:     Right lower leg: No edema.     Left lower leg: No edema.  Skin:    General: Skin is warm and dry.     Coloration: Skin is not jaundiced.     Findings: No rash.      Nails: There is no clubbing.  Neurological:     Mental Status: He is alert. Mental status is at baseline.     Cranial Nerves: No dysarthria or facial asymmetry.     Motor: No tremor or abnormal muscle tone.     Gait: Gait normal.  Psychiatric:        Mood and Affect: Mood normal.        Speech: Speech normal.        Behavior: Behavior normal. Behavior is cooperative.      Recent Results (from the past 2160 hour(s))  Testosterone     Status: Abnormal   Collection Time: 12/04/22  8:06 AM  Result Value Ref Range   Testosterone 252 (L) 264 - 916 ng/dL    Comment: Adult male reference interval is based on a population of healthy nonobese males (BMI <30) between 31 and 12 years old. [place], [place] [phone]. PMID: [id number].     Fall Risk:    02/18/2023    8:49 AM 04/13/2022    3:50 PM 03/29/2022    8:36 AM 02/14/2022    7:58 AM 02/09/2021   10:29 AM  Fall Risk   Falls in the past year? 0 0 0 0 0  Number falls in past yr: 0 0 0 0 0  Injury with Fall? 0 0 0 0 0  Risk for fall due to : No Fall Risks No Fall Risks No Fall Risks No Fall Risks   Follow up Falls prevention discussed;Education provided;Falls evaluation completed Falls prevention discussed;Education provided Falls prevention discussed Falls prevention discussed     Functional Status Survey: Is the patient deaf or have difficulty hearing?: No Does the patient have difficulty seeing, even when wearing glasses/contacts?: No Does the patient have difficulty concentrating, remembering, or making decisions?: No Does the patient have difficulty walking  or climbing stairs?: No Does the patient have difficulty dressing or bathing?: No Does the patient have difficulty doing errands alone such as visiting a doctor's office or shopping?: No   Assessment & Plan:    CPE completed today  Prostate cancer screening and PSA options (with potential risks and benefits of testing vs not testing) were discussed along  with recent recs/guidelines, shared decision making and handout/information given to pt today  USPSTF grade A and B recommendations reviewed with patient; age-appropriate recommendations, preventive care, screening tests, etc discussed and encouraged; healthy living encouraged; see AVS for patient education given to patient  Discussed importance of 150 minutes of physical activity weekly, AHA exercise recommendations given to pt in AVS/handout  Discussed importance of healthy diet:  eating lean meats and proteins, avoiding trans fats and saturated fats, avoid simple sugars and excessive carbs in diet, eat 6 servings of fruit/vegetables daily and drink plenty of water and avoid sweet beverages.  DASH diet reviewed if pt has HTN  Recommended pt to do annual eye exam and routine dental exams/cleanings  Advance Care planning information and packet discussed and offered today, encouraged pt to discuss with family members/spouse/partner/friends and complete Advanced directive packet and bring copy to office   Reviewed Health Maintenance: Health Maintenance  Topic Date Due   DTaP/Tdap/Td (2 - Td or Tdap) 01/07/2022   INFLUENZA VACCINE  03/31/2023 (Originally 07/31/2022)   COLONOSCOPY (Pts 45-107yr Insurance coverage will need to be confirmed)  03/30/2025   Hepatitis C Screening  Completed   HIV Screening  Completed   HPV VACCINES  Aged Out   COVID-19 Vaccine  Discontinued    Immunizations: Immunization History  Administered Date(s) Administered   Tdap 01/08/2012   Vaccines:  HPV: up to at age 47, ask insurance if age between 272-45 Shingrix: 532-64yo and ask insurance if covered when patient above 62yo Pneumonia: not indicated currently educated and discussed with patient. Flu:  educated and discussed with patient. COVID:    Recheck A1C Lab Results  Component Value Date   HGBA1C 5.7 (H) 02/14/2022       ICD-10-CM   1. Annual physical exam  [id number] METABOLIC PANEL WITH GFR     Lipid panel    CBC with Differential/Platelet    Hemoglobin A1c    TSH    VITAMIN D 25 Hydroxy (Vit-D Deficiency, Fractures)    Testosterone    2. Low testosterone in male  RR72.89TSH    Testosterone   per urology    3. Gynecomastia  N62 TSH    Testosterone   sx have largely improved with management of low t    4. OSA on CPAP  G47.33    excellent compliance, he follows with sleep medicine    5. Hyperlipidemia LDL goal <100  E78.5    working on diet/lifestyle efforts, lost 30+ lbs    6. Prediabetes  R73.03 Hemoglobin A1c   sig improvement in diet/lifestyle and weight    7. Generalized anxiety disorder  F41.1    working with psychiatry, was able to get off all meds    8. Palpitation  R00.2    episodes of rapid forceful HR, sweats, during the day and at nighttime, sometimes associated with stress but not always          LDelsa Grana PA-C 02/18/23 9:28 AM  [facility]

## 2023-02-19 LAB — CBC WITH DIFFERENTIAL/PLATELET
Absolute Monocytes: 418 cells/uL (ref 200–950)
Basophils Absolute: 41 cells/uL (ref 0–200)
Basophils Relative: 1 %
Eosinophils Absolute: 41 cells/uL (ref 15–500)
Eosinophils Relative: 1 %
HCT: 42 % (ref 38.5–50.0)
Hemoglobin: 14.2 g/dL (ref 13.2–17.1)
Lymphs Abs: 1410 cells/uL (ref 850–3900)
MCH: 29.6 pg (ref 27.0–33.0)
MCHC: 33.8 g/dL (ref 32.0–36.0)
MCV: 87.5 fL (ref 80.0–100.0)
MPV: 11.2 fL (ref 7.5–12.5)
Monocytes Relative: 10.2 %
Neutro Abs: 2189 cells/uL (ref 1500–7800)
Neutrophils Relative %: 53.4 %
Platelets: 182 10*3/uL (ref 140–400)
RBC: 4.8 10*6/uL (ref 4.20–5.80)
RDW: 12.8 % (ref 11.0–15.0)
Total Lymphocyte: 34.4 %
WBC: 4.1 10*3/uL (ref 3.8–10.8)

## 2023-02-19 LAB — COMPLETE METABOLIC PANEL WITH GFR
AG Ratio: 1.8 (calc) (ref 1.0–2.5)
ALT: 24 U/L (ref 9–46)
AST: 17 U/L (ref 10–40)
Albumin: 4.3 g/dL (ref 3.6–5.1)
Alkaline phosphatase (APISO): 50 U/L (ref 36–130)
BUN: 14 mg/dL (ref 7–25)
CO2: 25 mmol/L (ref 20–32)
Calcium: 9 mg/dL (ref 8.6–10.3)
Chloride: 106 mmol/L (ref 98–110)
Creat: 0.92 mg/dL (ref 0.60–1.29)
Globulin: 2.4 g/dL (calc) (ref 1.9–3.7)
Glucose, Bld: 78 mg/dL (ref 65–99)
Potassium: 4 mmol/L (ref 3.5–5.3)
Sodium: 144 mmol/L (ref 135–146)
Total Bilirubin: 0.4 mg/dL (ref 0.2–1.2)
Total Protein: 6.7 g/dL (ref 6.1–8.1)
eGFR: 103 mL/min/{1.73_m2} (ref >=60)

## 2023-02-19 LAB — HEMOGLOBIN A1C
Hgb A1c MFr Bld: 5.7 % of total Hgb — ABNORMAL HIGH (ref <5.7)
Mean Plasma Glucose: 117 mg/dL
eAG (mmol/L): 6.5 mmol/L

## 2023-02-19 LAB — LIPID PANEL
Cholesterol: 180 mg/dL (ref <200)
HDL: 34 mg/dL — ABNORMAL LOW (ref >=40)
LDL Cholesterol (Calc): 124 mg/dL (calc) — ABNORMAL HIGH
Non-HDL Cholesterol (Calc): 146 mg/dL (calc) — ABNORMAL HIGH (ref <130)
Total CHOL/HDL Ratio: 5.3 (calc) — ABNORMAL HIGH (ref <5.0)
Triglycerides: 113 mg/dL (ref <150)

## 2023-02-19 LAB — TSH: TSH: 1.32 mIU/L (ref 0.40–4.50)

## 2023-02-19 LAB — TESTOSTERONE: Testosterone: 294 ng/dL (ref 250–827)

## 2023-02-19 LAB — VITAMIN D 25 HYDROXY (VIT D DEFICIENCY, FRACTURES): Vit D, 25-Hydroxy: 48 ng/mL (ref 30–100)

## 2023-02-25 DIAGNOSIS — G4733 Obstructive sleep apnea (adult) (pediatric): Secondary | ICD-10-CM | POA: Diagnosis not present

## 2023-02-26 NOTE — Addendum Note (Signed)
Addended by: Delsa Grana on: 02/26/2023 10:35 AM   Modules accepted: Orders

## 2023-03-05 DIAGNOSIS — E230 Hypopituitarism: Secondary | ICD-10-CM | POA: Diagnosis not present

## 2023-03-07 DIAGNOSIS — E230 Hypopituitarism: Secondary | ICD-10-CM | POA: Diagnosis not present

## 2023-03-13 ENCOUNTER — Other Ambulatory Visit: Payer: Self-pay | Admitting: Internal Medicine

## 2023-03-13 DIAGNOSIS — E230 Hypopituitarism: Secondary | ICD-10-CM

## 2023-03-25 ENCOUNTER — Ambulatory Visit
Admission: RE | Admit: 2023-03-25 | Discharge: 2023-03-25 | Disposition: A | Discharge status: Home / Self Care | Payer: BC Managed Care – PPO | Source: Ambulatory Visit | Attending: Internal Medicine | Admitting: Internal Medicine

## 2023-03-25 DIAGNOSIS — G459 Transient cerebral ischemic attack, unspecified: Secondary | ICD-10-CM | POA: Diagnosis not present

## 2023-03-25 DIAGNOSIS — E230 Hypopituitarism: Secondary | ICD-10-CM | POA: Diagnosis not present

## 2023-03-25 DIAGNOSIS — G4733 Obstructive sleep apnea (adult) (pediatric): Secondary | ICD-10-CM | POA: Diagnosis not present

## 2023-03-25 MED ORDER — GADOBUTROL 1 MMOL/ML IV SOLN
10.0000 mL | Freq: Once | INTRAVENOUS | Status: AC | PRN
  Administered 2023-03-25: 10 mL via INTRAVENOUS

## 2023-03-26 DIAGNOSIS — G4733 Obstructive sleep apnea (adult) (pediatric): Secondary | ICD-10-CM | POA: Diagnosis not present

## 2023-04-04 ENCOUNTER — Other Ambulatory Visit: Payer: Self-pay | Admitting: *Deleted

## 2023-04-04 MED ORDER — CLOMIPHENE CITRATE 50 MG PO TABS: 25.0000 mg | ORAL_TABLET | Freq: Every day | ORAL | 1 refills | Status: DC

## 2023-04-04 NOTE — Telephone Encounter (Signed)
Clomid refilled.  Schedule follow-up visit October 2024 with testosterone, PSA, hematocrit prior

## 2023-04-25 DIAGNOSIS — G4733 Obstructive sleep apnea (adult) (pediatric): Secondary | ICD-10-CM | POA: Diagnosis not present

## 2023-05-25 DIAGNOSIS — G4733 Obstructive sleep apnea (adult) (pediatric): Secondary | ICD-10-CM | POA: Diagnosis not present

## 2023-06-23 DIAGNOSIS — G4733 Obstructive sleep apnea (adult) (pediatric): Secondary | ICD-10-CM | POA: Diagnosis not present

## 2023-06-26 DIAGNOSIS — G4733 Obstructive sleep apnea (adult) (pediatric): Secondary | ICD-10-CM | POA: Diagnosis not present

## 2023-07-22 DIAGNOSIS — R7989 Other specified abnormal findings of blood chemistry: Secondary | ICD-10-CM | POA: Diagnosis not present

## 2023-07-22 DIAGNOSIS — G4733 Obstructive sleep apnea (adult) (pediatric): Secondary | ICD-10-CM | POA: Diagnosis not present

## 2023-07-23 DIAGNOSIS — G4733 Obstructive sleep apnea (adult) (pediatric): Secondary | ICD-10-CM | POA: Diagnosis not present

## 2023-08-18 ENCOUNTER — Other Ambulatory Visit: Payer: Self-pay | Admitting: Urology

## 2023-08-19 ENCOUNTER — Telehealth: Payer: Self-pay | Admitting: Urology

## 2023-08-19 ENCOUNTER — Other Ambulatory Visit: Payer: Self-pay | Admitting: *Deleted

## 2023-08-19 MED ORDER — CLOMIPHENE CITRATE 50 MG PO TABS: 25.0000 mg | ORAL_TABLET | Freq: Every day | ORAL | 1 refills | Status: DC

## 2023-08-19 NOTE — Telephone Encounter (Signed)
Patient called and stated that pharmacy told him to reach out to provider for refill of Clomid 50 mg tablet. [facility] in [place].

## 2023-08-19 NOTE — Telephone Encounter (Signed)
Refilled medication

## 2023-08-23 DIAGNOSIS — G4733 Obstructive sleep apnea (adult) (pediatric): Secondary | ICD-10-CM | POA: Diagnosis not present

## 2023-09-04 ENCOUNTER — Ambulatory Visit: Payer: BC Managed Care – PPO | Admitting: Dermatology

## 2023-09-04 ENCOUNTER — Encounter: Payer: Self-pay | Admitting: Dermatology

## 2023-09-04 DIAGNOSIS — L578 Other skin changes due to chronic exposure to nonionizing radiation: Secondary | ICD-10-CM

## 2023-09-04 DIAGNOSIS — Z1283 Encounter for screening for malignant neoplasm of skin: Secondary | ICD-10-CM | POA: Diagnosis not present

## 2023-09-04 DIAGNOSIS — Z7189 Other specified counseling: Secondary | ICD-10-CM

## 2023-09-04 DIAGNOSIS — L219 Seborrheic dermatitis, unspecified: Secondary | ICD-10-CM

## 2023-09-04 DIAGNOSIS — Z79899 Other long term (current) drug therapy: Secondary | ICD-10-CM

## 2023-09-04 DIAGNOSIS — L814 Other melanin hyperpigmentation: Secondary | ICD-10-CM

## 2023-09-04 DIAGNOSIS — D229 Melanocytic nevi, unspecified: Secondary | ICD-10-CM | POA: Diagnosis not present

## 2023-09-04 DIAGNOSIS — Z86018 Personal history of other benign neoplasm: Secondary | ICD-10-CM

## 2023-09-04 DIAGNOSIS — B353 Tinea pedis: Secondary | ICD-10-CM

## 2023-09-04 MED ORDER — HYDROCORTISONE 2.5 % EX LOTN: TOPICAL_LOTION | CUTANEOUS | 6 refills | Status: AC

## 2023-09-04 MED ORDER — KETOCONAZOLE 2 % EX CREA: TOPICAL_CREAM | CUTANEOUS | 11 refills | Status: AC

## 2023-09-04 NOTE — Patient Instructions (Signed)

## 2023-09-04 NOTE — Progress Notes (Signed)
Follow-Up Visit   Subjective  Martin Parks is a 48 y.o. male who presents for the following: Skin Cancer Screening and Full Body Skin Exam  The patient presents for Total-Body Skin Exam (TBSE) for skin cancer screening and mole check. The patient has spots, moles and lesions to be evaluated, some may be new or changing and the patient may have concern these could be cancer.  The following portions of the chart were reviewed this encounter and updated as appropriate: medications, allergies, medical history  Review of Systems:  No other skin or systemic complaints except as noted in HPI or Assessment and Plan.  Objective  Well appearing patient in no apparent distress; mood and affect are within normal limits.  A full examination was performed including scalp, head, eyes, ears, nose, lips, neck, chest, axillae, abdomen, back, buttocks, bilateral upper extremities, bilateral lower extremities, hands, feet, fingers, toes, fingernails, and toenails. All findings within normal limits unless otherwise noted below.   Relevant physical exam findings are noted in the Assessment and Plan.    Assessment & Plan   SKIN CANCER SCREENING PERFORMED TODAY.  ACTINIC DAMAGE - Chronic condition, secondary to cumulative UV/sun exposure - diffuse scaly erythematous macules with underlying dyspigmentation - Recommend daily broad spectrum sunscreen SPF 30+ to sun-exposed areas, reapply every 2 hours as needed.  - Staying in the shade or wearing long sleeves, sun glasses (UVA+UVB protection) and wide brim hats (4-inch brim around the entire circumference of the hat) are also recommended for sun protection.  - Call for new or changing lesions.  LENTIGINES, SEBORRHEIC KERATOSES, HEMANGIOMAS - Benign normal skin lesions - Benign-appearing - Call for any changes  MELANOCYTIC NEVI - Tan-brown and/or pink-flesh-colored symmetric macules and papules - Benign appearing on exam today - Observation -  Call clinic for new or changing moles - Recommend daily use of broad spectrum spf 30+ sunscreen to sun-exposed areas.   HISTORY OF DYSPLASTIC NEVUS No evidence of recurrence today Recommend regular full body skin exams Recommend daily broad spectrum sunscreen SPF 30+ to sun-exposed areas, reapply every 2 hours as needed.  Call if any new or changing lesions are noted between office visits  TINEA PEDIS Exam: Scaling and maceration web spaces and over distal and lateral soles. Chronic and persistent condition with duration or expected duration over one year. Condition is symptomatic / bothersome to patient. Not to goal. Treatment Plan: Continue Ketoconazole 2% cream to feet QHS.   SEBORRHEIC DERMATITIS Exam: Pink patches with greasy scale at face Chronic condition with duration or expected duration over one year. Currently well-controlled. Seborrheic Dermatitis is a chronic persistent rash characterized by pinkness and scaling most commonly of the mid face but also can occur on the scalp (dandruff), ears; mid chest, mid back and groin.  It tends to be exacerbated by stress and cooler weather.  People who have neurologic disease may experience new onset or exacerbation of existing seborrheic dermatitis.  The condition is not curable but treatable and can be controlled.  Treatment Plan: Continue HC 2.5% cream every other day alternating with Ketoconazole cream every other day.  Seborrheic dermatitis  Related Medications hydrocortisone 2.5 % lotion Apply topically 3 (three) times a week. Apply to rash on chin 3 nights weekly, Tuesday, Thursday and Saturday  Acrochordons (Skin Tags) - Fleshy, skin-colored pedunculated papules - Benign appearing.  - Observe. - If desired, they can be removed with an in office procedure that is not covered by insurance. - Please call the clinic if  you notice any new or changing lesions.  Return in about 1 year (around 09/03/2024) for TBSE.  Maylene Roes, CMA, am acting as scribe for Armida Sans, MD .  Documentation: I have reviewed the above documentation for accuracy and completeness, and I agree with the above.  Armida Sans, MD

## 2023-09-26 DIAGNOSIS — G4733 Obstructive sleep apnea (adult) (pediatric): Secondary | ICD-10-CM | POA: Diagnosis not present

## 2023-10-04 ENCOUNTER — Other Ambulatory Visit: Payer: BC Managed Care – PPO

## 2023-10-04 DIAGNOSIS — E291 Testicular hypofunction: Secondary | ICD-10-CM

## 2023-10-05 LAB — HEMOGLOBIN AND HEMATOCRIT, BLOOD
Hematocrit: 45.7 % (ref 37.5–51.0)
Hemoglobin: 14.8 g/dL (ref 13.0–17.7)

## 2023-10-05 LAB — PSA: Prostate Specific Ag, Serum: 0.7 ng/mL (ref 0.0–4.0)

## 2023-10-05 LAB — TESTOSTERONE: Testosterone: 411 ng/dL (ref 264–916)

## 2023-10-09 ENCOUNTER — Ambulatory Visit: Payer: BC Managed Care – PPO | Admitting: Urology

## 2023-10-09 ENCOUNTER — Encounter: Payer: Self-pay | Admitting: Urology

## 2023-10-09 VITALS — BP 132/80 | HR 73 | Ht 71.0 in | Wt 230.0 lb

## 2023-10-09 DIAGNOSIS — E291 Testicular hypofunction: Secondary | ICD-10-CM

## 2023-10-09 MED ORDER — CLOMIPHENE CITRATE 50 MG PO TABS: 50.0000 mg | ORAL_TABLET | Freq: Every day | ORAL | 1 refills | Status: DC

## 2023-10-09 NOTE — Progress Notes (Signed)
I, Martin Parks, acting as a scribe for Martin Altes, MD., have documented all relevant documentation on the behalf of Martin Altes, MD, as directed by  Martin Altes, MD while in the presence of Martin Altes, MD.  10/09/2023 1:54 PM   Martin Parks [birth year] [id number]  Referring provider: Danelle Berry, PA-C [address] [place],  [place] [address]  Chief Complaint  Patient presents with   Elevated PSA   Uroliogc history: 1.  Hypogonadism Clomid  HPI: Martin Parks is a 48 y.o. male presents for annual follow-up.   Initially seen 05/11/2022 with testosterone level 140 and complaints of tiredness/fatigue, decreased libido, mild ED and left breast tenderness.  LH level was low normal and he elected a trial of Clomid Remains on Clomid and although his symptoms have improved, he is still having some symptoms of tiredness, fatigue and depressed mood.  No bothersome LUTS.  Lab 10/04/2023 testosterone 411, PSA 0.7, H/H normal.    PMH: Past Medical History:  Diagnosis Date   Anxiety    Complication of anesthesia    hard time waking up after hernia surgery   Depression    Dysplastic nevus    Back x 2, treated at Dr. Charna Elizabeth office, will request pathology   GERD (gastroesophageal reflux disease)    h/o    Headache    migraines   History of kidney stones    currently   Hypertension    h/o years ago due to anxiety-taken off bp meds once anxiety meds started helping   Pneumonia    h/o    Umbilical hernia without obstruction and without gangrene 12/22/2018    Surgical History: Past Surgical History:  Procedure Laterality Date   COLONOSCOPY WITH PROPOFOL N/A 03/30/2022   Procedure: COLONOSCOPY WITH PROPOFOL;  Surgeon: Wyline Mood, MD;  Location: [facility] ENDOSCOPY;  Service: Gastroenterology;  Laterality: N/A;   HERNIA REPAIR     inguinal   TONSILLECTOMY     UMBILICAL HERNIA REPAIR N/A 03/02/2019   Procedure: OPEN HERNIA REPAIR UMBILICAL  ADULT WITH MESH;  Surgeon: Ancil Linsey, MD;  Location: [facility] ORS;  Service: General;  Laterality: N/A;    Home Medications:  Allergies as of 10/09/2023       Reactions   Grass Extracts [gramineae Pollens]    Adhesive [tape] Rash        Medication List        Accurate as of October 09, 2023  1:54 PM. If you have any questions, ask your nurse or doctor.          STOP taking these medications    loratadine 10 MG tablet Commonly known as: CLARITIN Stopped by: Martin Parks       TAKE these medications    ALPRAZolam 0.5 MG tablet Commonly known as: XANAX Take 0.5 mg by mouth 2 (two) times daily as needed for anxiety.   b complex vitamins capsule Take 1 capsule by mouth daily.   cholecalciferol 25 MCG (1000 UNIT) tablet Commonly known as: VITAMIN D3 Take 5,000 Units by mouth daily.   clomiPHENE 50 MG tablet Commonly known as: CLOMID Take 1 tablet (50 mg total) by mouth daily. What changed: how much to take Changed by: Verna Czech Norris Bodley   Fish Oil 1000 MG Caps Take 1,000 mg by mouth daily.   hydrocortisone 2.5 % lotion Apply topically 3 (three) times a week. Apply to rash on chin 3 nights weekly, Tuesday, Thursday and Saturday  ketoconazole 2 % cream Commonly known as: NIZORAL Apply to the feet QHS and apply to the face QD on Monday, Wednesday, and Friday.   mometasone 0.1 % cream Commonly known as: ELOCON Apply 1 Application topically daily as needed (Rash).   Turmeric 500 MG Caps Take 500 mg by mouth daily.        Allergies:  Allergies  Allergen Reactions   Grass Extracts [Gramineae Pollens]    Adhesive [Tape] Rash    Family History: Family History  Problem Relation Age of Onset   Cancer Sister        breast    Social History:  reports that he has never smoked. He has never used smokeless tobacco. He reports that he does not currently use alcohol. He reports that he does not use drugs.   Physical Exam: BP 132/80   Pulse 73    Ht 5\' 11"  (1.803 m)   Wt 230 lb (104.3 kg)   BMI 32.08 kg/m   Constitutional:  Alert and oriented, No acute distress. HEENT: [place] AT Cardiovascular: No clubbing, cyanosis, or edema. Respiratory: Normal respiratory effort, no increased work of breathing. Psychiatric: Normal mood and affect.   Assessment & Plan:    1. Hypogonadism The majority of his testosterone levels since starting Clomid have been below 500. He would like to increase to 50 mg daily and will recheck a testosterone level in 6 weeks.  If no significant improvement in his testosterone level or persistent symptoms, recommend switching to TRT.  We discussed several available options including topical gel, intramuscular testosterone, subcutaneous testosterone, and pellet implantation.   I have reviewed the above documentation for accuracy and completeness, and I agree with the above.   Martin Altes, MD  [facility] [address], [address] [place], [place] [address] [phone]

## 2023-10-26 DIAGNOSIS — G4733 Obstructive sleep apnea (adult) (pediatric): Secondary | ICD-10-CM | POA: Diagnosis not present

## 2023-11-21 ENCOUNTER — Other Ambulatory Visit: Payer: BC Managed Care – PPO

## 2023-11-21 DIAGNOSIS — E291 Testicular hypofunction: Secondary | ICD-10-CM

## 2023-11-24 LAB — TESTOSTERONE,FREE AND TOTAL
Testosterone, Free: 12.8 pg/mL (ref 6.8–21.5)
Testosterone: 523 ng/dL (ref 264–916)

## 2023-11-26 DIAGNOSIS — G4733 Obstructive sleep apnea (adult) (pediatric): Secondary | ICD-10-CM | POA: Diagnosis not present

## 2023-12-03 ENCOUNTER — Encounter: Payer: Self-pay | Admitting: Urology

## 2023-12-03 ENCOUNTER — Other Ambulatory Visit: Payer: Self-pay | Admitting: Urology

## 2023-12-03 MED ORDER — CLOMIPHENE CITRATE 50 MG PO TABS: 50.0000 mg | ORAL_TABLET | Freq: Every day | ORAL | 1 refills | Status: DC

## 2023-12-26 DIAGNOSIS — G4733 Obstructive sleep apnea (adult) (pediatric): Secondary | ICD-10-CM | POA: Diagnosis not present

## 2024-01-26 DIAGNOSIS — G4733 Obstructive sleep apnea (adult) (pediatric): Secondary | ICD-10-CM | POA: Diagnosis not present

## 2024-02-12 ENCOUNTER — Other Ambulatory Visit: Payer: Self-pay | Admitting: Urology

## 2024-02-21 ENCOUNTER — Encounter: Payer: Self-pay | Admitting: Family Medicine

## 2024-02-24 NOTE — Progress Notes (Unsigned)
 Patient: Martin Parks, Male    DOB: 20-Jun-1975, 49 y.o.   MRN: [id number] Martin Berry, PA-C Visit Date: 02/25/2024  Today's Provider: Danelle Berry, PA-C   Chief Complaint  Patient presents with   Annual Exam   Subjective:   Annual physical exam:  Martin Parks is a 49 y.o. male who presents today for health maintenance and annual & complete physical exam.   Exercise/Activity:  3 x a week an hour Diet/nutrition:  working on healthy diet/lifestyle Sleep:  CPAP still wakes up around 3-4 am  Working on diet/lifestyle significantly, cut out sugars and carbs, on supplements vit d zinc  SDOH Screenings   Food Insecurity: No Food Insecurity (02/25/2024)  Housing: Unknown (02/25/2024)  Transportation Needs: No Transportation Needs (02/25/2024)  Utilities: Not At Risk (02/18/2023)  Alcohol Screen: Low Risk  (02/09/2020)  Depression (PHQ2-9): Low Risk  (02/25/2024)  Financial Resource Strain: Low Risk  (02/25/2024)  Physical Activity: Sufficiently Active (02/25/2024)  Social Connections: Socially Integrated (02/25/2024)  Stress: No Stress Concern Present (02/25/2024)  Tobacco Use: Low Risk  (02/25/2024)  Health Literacy: Adequate Health Literacy (02/25/2024)   Asks for testosterone testing to be added today  USPSTF grade A and B recommendations - reviewed and addressed today  Depression:  Phq 9 completed today by patient, was reviewed by me with patient in the room, score is  negative, pt feels mood is good, trying to be positive and focus on the good because there is a lot of hard things and major life changes    02/25/2024    8:21 AM 02/18/2023    8:49 AM 04/13/2022    3:51 PM  Depression screen PHQ 2/9  Decreased Interest 0 0 0  Down, Depressed, Hopeless 0 0 0  PHQ - 2 Score 0 0 0  Altered sleeping 1 0 0  Tired, decreased energy 1 0 0  Change in appetite 0 0 0  Feeling bad or failure about yourself  0 0 0  Trouble concentrating 0 0 0  Moving slowly or fidgety/restless 0 0  0  Suicidal thoughts 0 0 0  PHQ-9 Score 2 0 0  Difficult doing work/chores  Not difficult at all Not difficult at all    Hep C Screening: done previously  STD testing and prevention (HIV/chl/gon/syphilis): done previously, no exposure or concerns   Intimate partner violence:  safe at home   Advanced Care Planning:  A voluntary discussion about advance care planning including the explanation and discussion of advance directives.  Discussed health care proxy and Living will, and the patient was able to identify a health care proxy as Martin Parks wife.  Patient does not have a living will at present time. If patient does have living will, I have requested they bring this to the clinic to be scanned in to their chart.  Health Maintenance  Topic Date Due   INFLUENZA VACCINE  03/30/2024 (Originally 08/01/2023)   DTaP/Tdap/Td (2 - Td or Tdap) 02/23/2025 (Originally 01/07/2022)   Colonoscopy  03/30/2025   Hepatitis C Screening  Completed   HIV Screening  Completed   HPV VACCINES  Aged Out   COVID-19 Vaccine  Discontinued   Skin cancer:  last skin survey by derm - following    Colorectal cancer:  colonoscopy is due 2026  Prostate cancer: seeing urolog Prostate cancer screening with PSA: Discussed risks and benefits of PSA testing and provided handout. Pt declines - had done with urology  Lab Results  Component Value Date  PSA 0.40 02/14/2022   PSA 0.40 02/09/2021   PSA 0.4 09/25/2018    Urinary Symptoms:   IPSS     Row Name 02/25/24 4034         International Prostate Symptom Score   How often have you had the sensation of not emptying your bladder? Not at All     How often have you had to urinate less than every two hours? Not at All     How often have you found you stopped and started again several times when you urinated? Not at All     How often have you found it difficult to postpone urination? Not at All     How often have you had a weak urinary stream? Not at All      How often have you had to strain to start urination? Not at All     How many times did you typically get up at night to urinate? 1 Time     Total IPSS Score 1              Lung cancer:   Low Dose CT Chest recommended if Age 78-80 years, 20 pack-year currently smoking OR have quit w/in 15years. Patient does not qualify.   Social History   Tobacco Use   Smoking status: Never   Smokeless tobacco: Never  Substance Use Topics   Alcohol use: Not Currently    Comment: rare     Alcohol screening: Flowsheet Row Office Visit from 02/09/2020 in [facility]  AUDIT-C Score 1       AAA:  The USPSTF recommends one-time screening with ultrasonography in men ages 20 to 41 years who have ever smoked  ECG: none indicated today   Blood pressure/Hypertension: BP Readings from Last 3 Encounters:  02/25/24 130/78  10/09/23 132/80  02/18/23 120/72   Weight/Obesity: Wt Readings from Last 3 Encounters:  02/25/24 243 lb (110.2 kg)  10/09/23 230 lb (104.3 kg)  02/18/23 247 lb 1.6 oz (112.1 kg)   BMI Readings from Last 3 Encounters:  02/25/24 33.89 kg/m  10/09/23 32.08 kg/m  02/18/23 34.46 kg/m    Lipids:  Lab Results  Component Value Date   CHOL 180 02/18/2023   CHOL 215 (H) 02/14/2022   CHOL 234 (H) 02/09/2021   Lab Results  Component Value Date   HDL 34 (L) 02/18/2023   HDL 40 02/14/2022   HDL 40 02/09/2021   Lab Results  Component Value Date   LDLCALC 124 (H) 02/18/2023   LDLCALC 146 (H) 02/14/2022   LDLCALC 157 (H) 02/09/2021   Lab Results  Component Value Date   TRIG 113 02/18/2023   TRIG 155 (H) 02/14/2022   TRIG 205 (H) 02/09/2021   Lab Results  Component Value Date   CHOLHDL 5.3 (H) 02/18/2023   CHOLHDL 5.4 (H) 02/14/2022   CHOLHDL 5.9 (H) 02/09/2021   No results found for: "LDLDIRECT" Based on the results of lipid panel his/her cardiovascular risk factor ( using Poole Cohort )  in the next 10 years is : The 10-year ASCVD risk  score (Arnett DK, et al., 2019) is: 3.8%   Values used to calculate the score:     Age: 78 years     Sex: Male     Is Non-Hispanic African American: No     Diabetic: No     Tobacco smoker: No     Systolic Blood Pressure: 130 mmHg     Is BP  treated: No     HDL Cholesterol: 34 mg/dL     Total Cholesterol: 180 mg/dL  Glucose:  Glucose  Date Value Ref Range Status  01/27/2013 128 (H) 65 - 99 mg/dL Final   Glucose, Bld  Date Value Ref Range Status  02/18/2023 78 65 - 99 mg/dL Final    Comment:    .            Fasting reference interval .   02/14/2022 90 65 - 99 mg/dL Final    Comment:    .            Fasting reference interval .   02/09/2021 81 65 - 99 mg/dL Final    Comment:    .            Fasting reference interval .     Social History       Social History   Socioeconomic History   Marital status: Married    Spouse name: Benita Gutter   Number of children: 2   Years of education: 12   Highest education level: Bachelor's degree (e.g., BA, AB, BS)  Occupational History   Not on file  Tobacco Use   Smoking status: Never   Smokeless tobacco: Never  Vaping Use   Vaping status: Never Used  Substance and Sexual Activity   Alcohol use: Not Currently    Comment: rare   Drug use: No   Sexual activity: Yes    Partners: Female  Other Topics Concern   Not on file  Social History Narrative   Not on file   Social Drivers of Health   Financial Resource Strain: Low Risk  (02/25/2024)   Overall Financial Resource Strain (CARDIA)    Difficulty of Paying Living Expenses: Not hard at all  Food Insecurity: No Food Insecurity (02/25/2024)   Hunger Vital Sign    Worried About Running Out of Food in the Last Year: Never true    Ran Out of Food in the Last Year: Never true  Transportation Needs: No Transportation Needs (02/25/2024)   PRAPARE - Administrator, Civil Service (Medical): No    Lack of Transportation (Non-Medical): No  Physical Activity:  Sufficiently Active (02/25/2024)   Exercise Vital Sign    Days of Exercise per Week: 3 days    Minutes of Exercise per Session: 60 min  Stress: No Stress Concern Present (02/25/2024)   [employer] of Occupational Health - Occupational Stress Questionnaire    Feeling of Stress : Only a little  Social Connections: Socially Integrated (02/25/2024)   Social Connection and Isolation Panel [NHANES]    Frequency of Communication with Friends and Family: More than three times a week    Frequency of Social Gatherings with Friends and Family: More than three times a week    Attends Religious Services: 1 to 4 times per year    Active Member of [employer] or Organizations: Yes    Attends Engineer, structural: More than 4 times per year    Marital Status: Married    Family History        Family History  Problem Relation Age of Onset   Cancer Sister        breast    Patient Active Problem List   Diagnosis Date Noted   Class 2 severe obesity with serious comorbidity and body mass index (BMI) of 35.0 to 35.9 in adult (HCC) 02/09/2021   OSA on CPAP 06/05/2020   Prediabetes  02/10/2020   Hyperactivity 12/22/2018   Generalized anxiety disorder 12/22/2018   Hyperlipidemia LDL goal <100 12/22/2018    Past Surgical History:  Procedure Laterality Date   COLONOSCOPY WITH PROPOFOL N/A 03/30/2022   Procedure: COLONOSCOPY WITH PROPOFOL;  Surgeon: Wyline Mood, MD;  Location: [facility] ENDOSCOPY;  Service: Gastroenterology;  Laterality: N/A;   HERNIA REPAIR     inguinal   TONSILLECTOMY     UMBILICAL HERNIA REPAIR N/A 03/02/2019   Procedure: OPEN HERNIA REPAIR UMBILICAL ADULT WITH MESH;  Surgeon: Ancil Linsey, MD;  Location: [facility] ORS;  Service: General;  Laterality: N/A;     Current Outpatient Medications:    ALPRAZolam (XANAX) 0.5 MG tablet, Take 0.5 mg by mouth 2 (two) times daily as needed for anxiety. , Disp: , Rfl: 5   b complex vitamins capsule, Take 1 capsule by mouth daily., Disp: ,  Rfl:    cholecalciferol (VITAMIN D3) 25 MCG (1000 UNIT) tablet, Take 5,000 Units by mouth daily., Disp: , Rfl:    CLOMID 50 MG tablet, TAKE 1 TABLET(50 MG) BY MOUTH DAILY, Disp: 30 tablet, Rfl: 1   hydrocortisone 2.5 % lotion, Apply topically 3 (three) times a week. Apply to rash on chin 3 nights weekly, Tuesday, Thursday and Saturday, Disp: 59 mL, Rfl: 6   ketoconazole (NIZORAL) 2 % cream, Apply to the feet QHS and apply to the face QD on Monday, Wednesday, and Friday., Disp: 60 g, Rfl: 11   mometasone (ELOCON) 0.1 % cream, Apply 1 Application topically daily as needed (Rash)., Disp: 45 g, Rfl: 1   Omega-3 Fatty Acids (FISH OIL) 1000 MG CAPS, Take 1,000 mg by mouth daily. , Disp: , Rfl:    Turmeric 500 MG CAPS, Take 500 mg by mouth daily. , Disp: , Rfl:   Allergies  Allergen Reactions   Grass Extracts [Gramineae Pollens]    Adhesive [Tape] Rash    Patient Care Team: Martin Berry, PA-C as PCP - General (Family Medicine)   Chart Review: I personally reviewed active problem list, medication list, allergies, family history, social history, health maintenance, notes from last encounter, lab results, imaging with the patient/caregiver today.   Review of Systems  Constitutional: Negative.   HENT: Negative.    Eyes: Negative.   Respiratory: Negative.    Cardiovascular: Negative.   Gastrointestinal: Negative.   Endocrine: Negative.   Genitourinary: Negative.   Musculoskeletal: Negative.   Skin: Negative.   Allergic/Immunologic: Negative.   Neurological: Negative.   Hematological: Negative.   Psychiatric/Behavioral: Negative.    All other systems reviewed and are negative.         Objective:   Vitals:  Vitals:   02/25/24 0819  BP: 130/78  Pulse: 82  Resp: 16  SpO2: 99%  Weight: 243 lb (110.2 kg)  Height: 5\' 11"  (1.803 m)    Body mass index is 33.89 kg/m.  Physical Exam Vitals and nursing note reviewed.  Constitutional:      General: He is not in acute distress.     Appearance: Normal appearance. He is well-developed. He is obese. He is not ill-appearing, toxic-appearing or diaphoretic.  HENT:     Head: Normocephalic and atraumatic.     Right Ear: External ear normal.     Left Ear: External ear normal.     Nose: Congestion present. No rhinorrhea.     Mouth/Throat:     Mouth: Mucous membranes are moist.     Pharynx: Posterior oropharyngeal erythema present. No oropharyngeal exudate.  Eyes:  General: No scleral icterus.       Right eye: No discharge.        Left eye: No discharge.     Conjunctiva/sclera: Conjunctivae normal.  Neck:     Trachea: No tracheal deviation.  Cardiovascular:     Rate and Rhythm: Normal rate and regular rhythm.     Pulses: Normal pulses.     Heart sounds: Normal heart sounds. No murmur heard.    No friction rub. No gallop.  Pulmonary:     Effort: Pulmonary effort is normal. No respiratory distress.     Breath sounds: Normal breath sounds. No stridor. No wheezing, rhonchi or rales.  Abdominal:     General: Bowel sounds are normal.  Musculoskeletal:     Right lower leg: No edema.     Left lower leg: No edema.  Skin:    General: Skin is warm and dry.     Capillary Refill: Capillary refill takes less than 2 seconds.     Findings: No rash.  Neurological:     Mental Status: He is alert. Mental status is at baseline.     Motor: No abnormal muscle tone.     Coordination: Coordination normal.     Gait: Gait normal.  Psychiatric:        Mood and Affect: Mood normal.        Behavior: Behavior normal.      No results found for this or any previous visit (from the past 2160 hours).  Fall Risk:    02/18/2023    8:49 AM 04/13/2022    3:50 PM 03/29/2022    8:36 AM 02/14/2022    7:58 AM 02/09/2021   10:29 AM  Fall Risk   Falls in the past year? 0 0 0 0 0  Number falls in past yr: 0 0 0 0 0  Injury with Fall? 0 0 0 0 0  Risk for fall due to : No Fall Risks No Fall Risks No Fall Risks No Fall Risks   Follow up Falls  prevention discussed;Education provided;Falls evaluation completed Falls prevention discussed;Education provided Falls prevention discussed Falls prevention discussed     Functional Status Survey: Is the patient deaf or have difficulty hearing?: No Does the patient have difficulty seeing, even when wearing glasses/contacts?: No Does the patient have difficulty concentrating, remembering, or making decisions?: No Does the patient have difficulty walking or climbing stairs?: No Does the patient have difficulty dressing or bathing?: No Does the patient have difficulty doing errands alone such as visiting a doctor's office or shopping?: No   Assessment & Plan:    CPE completed today  Prostate cancer screening and PSA options (with potential risks and benefits of testing vs not testing) were discussed along with recent recs/guidelines, shared decision making and handout/information given to pt today  USPSTF grade A and B recommendations reviewed with patient; age-appropriate recommendations, preventive care, screening tests, etc discussed and encouraged; healthy living encouraged; see AVS for patient education given to patient  Discussed importance of 150 minutes of physical activity weekly, AHA exercise recommendations given to pt in AVS/handout  Discussed importance of healthy diet:  eating lean meats and proteins, avoiding trans fats and saturated fats, avoid simple sugars and excessive carbs in diet, eat 6 servings of fruit/vegetables daily and drink plenty of water and avoid sweet beverages.  DASH diet reviewed if pt has HTN  Recommended pt to do annual eye exam and routine dental exams/cleanings  Advance Care planning information and  packet discussed and offered today, encouraged pt to discuss with family members/spouse/partner/friends and complete Advanced directive packet and bring copy to office   Reviewed Health Maintenance: Health Maintenance  Topic Date Due   INFLUENZA VACCINE   03/30/2024 (Originally 08/01/2023)   DTaP/Tdap/Td (2 - Td or Tdap) 02/23/2025 (Originally 01/07/2022)   Colonoscopy  03/30/2025   Hepatitis C Screening  Completed   HIV Screening  Completed   HPV VACCINES  Aged Out   COVID-19 Vaccine  Discontinued    Immunizations: Immunization History  Administered Date(s) Administered   Tdap 01/08/2012   Vaccines:  HPV: up to at age 84 , ask insurance if age between 77-45  Shingrix: 93-64 yo and ask insurance if covered when patient above 57 yo Pneumonia:  educated and discussed with patient. Flu:  educated and discussed with patient.     ICD-10-CM   1. Well adult exam  Z00.00 Lipid Profile    CBC with Differential/Platelet    COMPLETE METABOLIC PANEL WITH GFR    HgB A1c    Testosterone Total,Free,Bio, Males    2. Low testosterone in male  R79.89 Testosterone Total,Free,Bio, Males   managed by urology but asked to have labs done here with other lab workm, on clomid          Martin Parks, Cordelia Poche 02/25/24 8:37 AM  [facility]

## 2024-02-25 ENCOUNTER — Encounter: Payer: Self-pay | Admitting: Family Medicine

## 2024-02-25 ENCOUNTER — Ambulatory Visit (INDEPENDENT_AMBULATORY_CARE_PROVIDER_SITE_OTHER): Payer: BC Managed Care – PPO | Admitting: Family Medicine

## 2024-02-25 VITALS — BP 130/78 | HR 82 | Resp 16 | Ht 71.0 in | Wt 243.0 lb

## 2024-02-25 DIAGNOSIS — Z0000 Encounter for general adult medical examination without abnormal findings: Secondary | ICD-10-CM

## 2024-02-25 DIAGNOSIS — Z0001 Encounter for general adult medical examination with abnormal findings: Secondary | ICD-10-CM | POA: Diagnosis not present

## 2024-02-25 DIAGNOSIS — R7989 Other specified abnormal findings of blood chemistry: Secondary | ICD-10-CM | POA: Diagnosis not present

## 2024-02-26 DIAGNOSIS — G4733 Obstructive sleep apnea (adult) (pediatric): Secondary | ICD-10-CM | POA: Diagnosis not present

## 2024-02-26 LAB — CBC WITH DIFFERENTIAL/PLATELET
Absolute Lymphocytes: 1794 {cells}/uL (ref 850–3900)
Absolute Monocytes: 348 {cells}/uL (ref 200–950)
Basophils Absolute: 52 {cells}/uL (ref 0–200)
Basophils Relative: 1 %
Eosinophils Absolute: 88 {cells}/uL (ref 15–500)
Eosinophils Relative: 1.7 %
HCT: 45.4 % (ref 38.5–50.0)
Hemoglobin: 14.7 g/dL (ref 13.2–17.1)
MCH: 29.6 pg (ref 27.0–33.0)
MCHC: 32.4 g/dL (ref 32.0–36.0)
MCV: 91.5 fL (ref 80.0–100.0)
MPV: 11.6 fL (ref 7.5–12.5)
Monocytes Relative: 6.7 %
Neutro Abs: 2917 {cells}/uL (ref 1500–7800)
Neutrophils Relative %: 56.1 %
Platelets: 168 10*3/uL (ref 140–400)
RBC: 4.96 10*6/uL (ref 4.20–5.80)
RDW: 12.6 % (ref 11.0–15.0)
Total Lymphocyte: 34.5 %
WBC: 5.2 10*3/uL (ref 3.8–10.8)

## 2024-02-26 LAB — COMPLETE METABOLIC PANEL WITH GFR
AG Ratio: 1.9 (calc) (ref 1.0–2.5)
ALT: 15 U/L (ref 9–46)
AST: 13 U/L (ref 10–40)
Albumin: 4.7 g/dL (ref 3.6–5.1)
Alkaline phosphatase (APISO): 43 U/L (ref 36–130)
BUN: 15 mg/dL (ref 7–25)
CO2: 28 mmol/L (ref 20–32)
Calcium: 9 mg/dL (ref 8.6–10.3)
Chloride: 104 mmol/L (ref 98–110)
Creat: 0.87 mg/dL (ref 0.60–1.29)
Globulin: 2.5 g/dL (ref 1.9–3.7)
Glucose, Bld: 94 mg/dL (ref 65–99)
Potassium: 4.1 mmol/L (ref 3.5–5.3)
Sodium: 140 mmol/L (ref 135–146)
Total Bilirubin: 0.4 mg/dL (ref 0.2–1.2)
Total Protein: 7.2 g/dL (ref 6.1–8.1)
eGFR: 106 mL/min/{1.73_m2} (ref >=60)

## 2024-02-26 LAB — TESTOSTERONE TOTAL,FREE,BIO, MALES
Albumin: 4.7 g/dL (ref 3.6–5.1)
Sex Hormone Binding: 24 nmol/L (ref 10–50)
Testosterone, Bioavailable: 225.7 ng/dL (ref 110.0–575.0)
Testosterone, Free: 105.3 pg/mL (ref 46.0–224.0)
Testosterone: 596 ng/dL (ref 250–827)

## 2024-02-26 LAB — LIPID PANEL
Cholesterol: 208 mg/dL — ABNORMAL HIGH (ref <200)
HDL: 40 mg/dL (ref >=40)
LDL Cholesterol (Calc): 144 mg/dL — ABNORMAL HIGH
Non-HDL Cholesterol (Calc): 168 mg/dL — ABNORMAL HIGH (ref <130)
Total CHOL/HDL Ratio: 5.2 (calc) — ABNORMAL HIGH (ref <5.0)
Triglycerides: 119 mg/dL (ref <150)

## 2024-02-26 LAB — HEMOGLOBIN A1C
Hgb A1c MFr Bld: 5.7 %{Hb} — ABNORMAL HIGH (ref <5.7)
Mean Plasma Glucose: 117 mg/dL
eAG (mmol/L): 6.5 mmol/L

## 2024-02-27 ENCOUNTER — Encounter: Payer: Self-pay | Admitting: Family Medicine

## 2024-03-25 ENCOUNTER — Other Ambulatory Visit: Payer: Self-pay

## 2024-03-25 ENCOUNTER — Encounter: Payer: Self-pay | Admitting: *Deleted

## 2024-03-25 ENCOUNTER — Emergency Department
Admission: EM | Admit: 2024-03-25 | Discharge: 2024-03-25 | Disposition: A | Discharge status: Home / Self Care | Attending: Emergency Medicine | Admitting: Emergency Medicine

## 2024-03-25 DIAGNOSIS — Z23 Encounter for immunization: Secondary | ICD-10-CM | POA: Insufficient documentation

## 2024-03-25 DIAGNOSIS — S61214A Laceration without foreign body of right ring finger without damage to nail, initial encounter: Secondary | ICD-10-CM | POA: Insufficient documentation

## 2024-03-25 DIAGNOSIS — S6991XA Unspecified injury of right wrist, hand and finger(s), initial encounter: Secondary | ICD-10-CM | POA: Diagnosis not present

## 2024-03-25 DIAGNOSIS — S61218A Laceration without foreign body of other finger without damage to nail, initial encounter: Secondary | ICD-10-CM

## 2024-03-25 DIAGNOSIS — Y93G1 Activity, food preparation and clean up: Secondary | ICD-10-CM | POA: Diagnosis not present

## 2024-03-25 DIAGNOSIS — S61210A Laceration without foreign body of right index finger without damage to nail, initial encounter: Secondary | ICD-10-CM | POA: Diagnosis not present

## 2024-03-25 DIAGNOSIS — G4733 Obstructive sleep apnea (adult) (pediatric): Secondary | ICD-10-CM | POA: Diagnosis not present

## 2024-03-25 DIAGNOSIS — W25XXXA Contact with sharp glass, initial encounter: Secondary | ICD-10-CM | POA: Insufficient documentation

## 2024-03-25 MED ORDER — TETANUS-DIPHTH-ACELL PERTUSSIS 5-2.5-18.5 LF-MCG/0.5 IM SUSY
0.5000 mL | PREFILLED_SYRINGE | Freq: Once | INTRAMUSCULAR | Status: AC
  Administered 2024-03-25: 0.5 mL via INTRAMUSCULAR
  Filled 2024-03-25: qty 0.5

## 2024-03-25 MED ORDER — LIDOCAINE HCL (PF) 1 % IJ SOLN
5.0000 mL | Freq: Once | INTRAMUSCULAR | Status: AC
  Administered 2024-03-25: 5 mL
  Filled 2024-03-25: qty 5

## 2024-03-25 NOTE — ED Notes (Signed)
 PA A. Evans at bedside at this time doing procedure.

## 2024-03-25 NOTE — ED Triage Notes (Signed)
 Pt has laceration to right fourth finger.  Pt cut finger on a glass dish while washing dishes today.  Bleeding controlled.  Pt alert

## 2024-03-25 NOTE — Discharge Instructions (Addendum)
 You have been diagnosed with a finger laceration. Please make an appointment with your PCP in 6 days to remove the suture. Please come back to ED or go to your PCP if you have new symptoms. You can take ibuprofen 600 mg as needed for pain.

## 2024-03-25 NOTE — ED Notes (Signed)
 Suture cart placed by pt room

## 2024-03-25 NOTE — ED Notes (Signed)
 Pt contact made and myself introduced. Pt CAOx4, breathing normally, and normal in color. Pt is lying in bed and appears relaxed at this time. Pt is complaining of 7/10 finger pain on his right hand after it was lacerated washing dishes. Same was wrapped in gauze and was not bleeding through. Pt wife sitting with him.

## 2024-03-25 NOTE — ED Provider Notes (Signed)
 [facility] Provider Note    Event Date/Time   First MD Initiated Contact with Patient 03/25/24 2121     (approximate)   History   Laceration   HPI  Martin Parks is a 49 y.o. male who presents today with laceration in the right fourth finger.  Patient was doing dishes and with  a glass.  Patient cannot remember last Tdap     Physical Exam   Triage Vital Signs: ED Triage Vitals  Encounter Vitals Group     BP 03/25/24 2018 (!) 145/91     Systolic BP Percentile --      Diastolic BP Percentile --      Pulse Rate 03/25/24 2018 77     Resp 03/25/24 2018 18     Temp 03/25/24 2018 99.6 F (37.6 C)     Temp src --      SpO2 03/25/24 2018 96 %     Weight 03/25/24 2019 230 lb (104.3 kg)     Height 03/25/24 2019 5\' 11"  (1.803 m)     Head Circumference --      Peak Flow --      Pain Score 03/25/24 2018 6     Pain Loc --      Pain Education --      Exclude from Growth Chart --     Most recent vital signs: Vitals:   03/25/24 2018 03/25/24 2127  BP: (!) 145/91 126/76  Pulse: 77 74  Resp: 18 18  Temp: 99.6 F (37.6 C)   SpO2: 96% 98%     Constitutional: Alert, NAD. Able to speak in complete sentences without cough or dyspnea  Eyes: Conjunctivae are normal.  Head: Atraumatic. Nose: No congestion/rhinnorhea. Mouth/Throat: Mucous membranes are moist.   Neck: Painless ROM. Supple. No JVD, nodes, thyromegaly  Cardiovascular:   Good peripheral circulation.RRR no murmurs, gallops, rubs  Respiratory: Normal respiratory effort.  No retractions. Clear to auscultation bilaterally without wheezing or crackles  Gastrointestinal: Soft and nontender.  Musculoskeletal:  no deformity Right hand: Laceration of the fourth finger the second phalange dorsal area.  Full ROM.  Sensation intact Neurologic:  MAE spontaneously. No gross focal neurologic deficits are appreciated.  Skin:  Skin is warm, dry and intact. No rash noted. Psychiatric: Mood and affect  are normal. Speech and behavior are normal.    ED Results / Procedures / Treatments   Labs (all labs ordered are listed, but only abnormal results are displayed) Labs Reviewed - No data to display   EKG    RADIOLOGY     PROCEDURES:  Critical Care performed:   .Laceration Repair  Date/Time: 03/25/2024 10:44 PM  Performed by: Gladys Damme, PA-C Authorized by: Gladys Damme, PA-C   Consent:    Consent obtained:  Verbal   Consent given by:  Patient   Risks, benefits, and alternatives were discussed: yes     Risks discussed:  Infection and pain Universal protocol:    Procedure explained and questions answered to patient or proxy's satisfaction: yes     Patient identity confirmed:  Verbally with patient Anesthesia:    Anesthesia method:  None Laceration details:    Location:  Finger   Finger location:  R ring finger   Length (cm):  4   Depth (mm):  3 Pre-procedure details:    Preparation:  Patient was prepped and draped in usual sterile fashion Exploration:    Imaging outcome: foreign body not noted   Treatment:  Area cleansed with:  Povidone-iodine   Amount of cleaning:  Standard   Irrigation solution:  Sterile saline   Irrigation method:  Pressure wash Skin repair:    Repair method:  Sutures   Suture size:  5-0   Suture material:  Nylon Approximation:    Approximation:  Close Repair type:    Repair type:  Simple Post-procedure details:    Dressing:  Non-adherent dressing   Procedure completion:  Tolerated well, no immediate complications    MEDICATIONS ORDERED IN ED: Medications  Tdap (BOOSTRIX) injection 0.5 mL (0.5 mLs Intramuscular Given 03/25/24 2204)  lidocaine (PF) (XYLOCAINE) 1 % injection 5 mL (5 mLs Other Given by Other 03/25/24 2209)      IMPRESSION / MDM / ASSESSMENT AND PLAN / ED COURSE  I reviewed the triage vital signs and the nursing notes.  Differential diagnosis includes, but is not limited to, laceration, avulsion,  fracture  Patient's presentation is most consistent with acute, uncomplicated illness.   Patient's diagnosis is consistent with laceration right fourth finger. I did review the patient's allergies and medications.Patient had Tdap booster.The patient is in stable and satisfactory condition for discharge home  Patient will be discharged home without prescriptions.  Patient is to follow up with PCP as needed or otherwise directed. Patient is given ED precautions to return to the ED for any worsening or new symptoms. Discussed plan of care with patient, answered all of patient's questions, Patient agreeable to plan of care. Patient verbalized understanding.    FINAL CLINICAL IMPRESSION(S) / ED DIAGNOSES   Final diagnoses:  Laceration of index finger without foreign body without damage to nail, unspecified laterality, initial encounter     Rx / DC Orders   ED Discharge Orders     None        Note:  This document was prepared using Dragon voice recognition software and may include unintentional dictation errors.   Gladys Damme, PA-C 03/25/24 2249    Jene Every, MD 03/25/24 2251

## 2024-04-18 ENCOUNTER — Other Ambulatory Visit: Payer: Self-pay | Admitting: Urology

## 2024-05-25 ENCOUNTER — Ambulatory Visit
Admission: EM | Admit: 2024-05-25 | Discharge: 2024-05-25 | Disposition: A | Discharge status: Home / Self Care | Attending: Emergency Medicine | Admitting: Emergency Medicine

## 2024-05-25 DIAGNOSIS — G4733 Obstructive sleep apnea (adult) (pediatric): Secondary | ICD-10-CM | POA: Diagnosis not present

## 2024-05-25 DIAGNOSIS — J069 Acute upper respiratory infection, unspecified: Secondary | ICD-10-CM | POA: Diagnosis not present

## 2024-05-25 DIAGNOSIS — R21 Rash and other nonspecific skin eruption: Secondary | ICD-10-CM

## 2024-05-25 DIAGNOSIS — L259 Unspecified contact dermatitis, unspecified cause: Secondary | ICD-10-CM | POA: Diagnosis not present

## 2024-05-25 MED ORDER — TRIAMCINOLONE ACETONIDE 0.5 % EX OINT: 1.0000 | TOPICAL_OINTMENT | Freq: Two times a day (BID) | CUTANEOUS | 0 refills | Status: AC

## 2024-05-25 NOTE — ED Provider Notes (Signed)
 [facility]    CSN: [id number] Arrival date & time: 05/25/24  1023      History   Chief Complaint Chief Complaint  Patient presents with   Rash   Cough   Headache   Fatigue    HPI Martin Parks is a 49 y.o. male.   Martin Parks, 49 year old male, presents to urgent care for evaluation of rash to left arm x 4 days.  Patient also complaining of bodyaches headache cough fatigue that started yesterday    The history is provided by the patient. No language interpreter was used.    Past Medical History:  Diagnosis Date   Anxiety    Complication of anesthesia    hard time waking up after hernia surgery   Depression    Dysplastic nevus    Back x 2, treated at Dr. Verlean Glee office, will request pathology   GERD (gastroesophageal reflux disease)    h/o    Headache    migraines   History of kidney stones    currently   Hypertension    h/o years ago due to anxiety-taken off bp meds once anxiety meds started helping   Pneumonia    h/o    Umbilical hernia without obstruction and without gangrene 12/22/2018    Patient Active Problem List   Diagnosis Date Noted   Contact dermatitis 05/25/2024   Rash and nonspecific skin eruption 05/25/2024   Viral URI with cough 05/25/2024   Class 2 severe obesity with serious comorbidity and body mass index (BMI) of 35.0 to 35.9 in adult (HCC) 02/09/2021   OSA on CPAP 06/05/2020   Prediabetes 02/10/2020   Hyperactivity 12/22/2018   Generalized anxiety disorder 12/22/2018   Hyperlipidemia LDL goal <100 12/22/2018    Past Surgical History:  Procedure Laterality Date   COLONOSCOPY WITH PROPOFOL  N/A 03/30/2022   Procedure: COLONOSCOPY WITH PROPOFOL ;  Surgeon: Luke Salaam, MD;  Location: [facility] ENDOSCOPY;  Service: Gastroenterology;  Laterality: N/A;   HERNIA REPAIR     inguinal   TONSILLECTOMY     UMBILICAL HERNIA REPAIR N/A 03/02/2019   Procedure: OPEN HERNIA REPAIR UMBILICAL ADULT WITH MESH;  Surgeon: Franki Isles, MD;  Location: [facility] ORS;  Service: General;  Laterality: N/A;       Home Medications    Prior to Admission medications   Medication Sig Start Date End Date Taking? Authorizing Provider  clomiPHENE  (CLOMID ) 50 MG tablet TAKE 1 TABLET(50 MG) BY MOUTH DAILY 04/20/24  Yes Stoioff, Scott C, MD  triamcinolone ointment (KENALOG) 0.5 % Apply 1 Application topically 2 (two) times daily for 7 days. To affected areas,avoid face 05/25/24 06/01/24 Yes Taylah Dubiel, NP  ALPRAZolam (XANAX) 0.5 MG tablet Take 0.5 mg by mouth 2 (two) times daily as needed for anxiety.  02/02/16   [provider]  b complex vitamins capsule Take 1 capsule by mouth daily.    [provider]  cholecalciferol (VITAMIN D3) 25 MCG (1000 UNIT) tablet Take 5,000 Units by mouth daily.    [provider]  hydrocortisone  2.5 % lotion Apply topically 3 (three) times a week. Apply to rash on chin 3 nights weekly, Tuesday, Thursday and Saturday 09/04/23   Elta Halter, MD  ketoconazole  (NIZORAL ) 2 % cream Apply to the feet QHS and apply to the face QD on Monday, Wednesday, and Friday. 09/04/23   Elta Halter, MD  mometasone  (ELOCON ) 0.1 % cream Apply 1 Application topically daily as needed (Rash). 08/01/22  Elta Halter, MD  Omega-3 Fatty Acids (FISH OIL) 1000 MG CAPS Take 1,000 mg by mouth daily.     [provider]  Turmeric 500 MG CAPS Take 500 mg by mouth daily.     [provider]    Family History Family History  Problem Relation Age of Onset   Cancer Sister        breast    Social History Social History   Tobacco Use   Smoking status: Never   Smokeless tobacco: Never  Vaping Use   Vaping status: Never Used  Substance Use Topics   Alcohol use: Not Currently    Comment: rare   Drug use: No     Allergies   Grass extracts [gramineae pollens] and Adhesive [tape]   Review of Systems Review of Systems  Constitutional:  Positive for fatigue.   Respiratory:  Positive for cough.   Musculoskeletal:  Positive for myalgias.  Skin:  Positive for rash.  Neurological:  Positive for headaches.  All other systems reviewed and are negative.    Physical Exam Triage Vital Signs ED Triage Vitals  Encounter Vitals Group     BP      Systolic BP Percentile      Diastolic BP Percentile      Pulse      Resp      Temp      Temp src      SpO2      Weight      Height      Head Circumference      Peak Flow      Pain Score      Pain Loc      Pain Education      Exclude from Growth Chart    No data found.  Updated Vital Signs BP 136/88 (BP Location: Right Arm)   Pulse 79   Temp 99.3 F (37.4 C) (Oral)   Resp 16   SpO2 97%   Visual Acuity Right Eye Distance:   Left Eye Distance:   Bilateral Distance:    Right Eye Near:   Left Eye Near:    Bilateral Near:     Physical Exam Vitals and nursing note reviewed.  Constitutional:      Appearance: He is well-developed and well-groomed.  HENT:     Head: Normocephalic.     Right Ear: Tympanic membrane is retracted.     Left Ear: Tympanic membrane is retracted.     Nose: Congestion present.     Mouth/Throat:     Lips: Pink.     Mouth: Mucous membranes are moist.     Pharynx: Oropharynx is clear. Uvula midline.  Cardiovascular:     Rate and Rhythm: Normal rate.  Pulmonary:     Effort: Pulmonary effort is normal.  Skin:    General: Skin is warm.     Capillary Refill: Capillary refill takes less than 2 seconds.     Findings: Rash present. Rash is vesicular.     Comments: Linear vesicular rash to left inner AC  Neurological:     General: No focal deficit present.     Mental Status: He is alert and oriented to person, place, and time.     Cranial Nerves: No cranial nerve deficit.     Sensory: No sensory deficit.  Psychiatric:        Attention and Perception: Attention normal.        Mood and Affect: Mood normal.  Speech: Speech normal.        Behavior: Behavior  normal. Behavior is cooperative.     UC Treatments / Results  Labs (all labs ordered are listed, but only abnormal results are displayed) Labs Reviewed - No data to display  EKG   Radiology No results found.  Procedures Procedures (including critical care time)  Medications Ordered in UC Medications - No data to display  Initial Impression / Assessment and Plan / UC Course  I have reviewed the triage vital signs and the nursing notes.  Pertinent labs & imaging results that were available during my care of the patient were reviewed by me and considered in my medical decision making (see chart for details).  Clinical Course as of 05/25/24 1913  Mon May 25, 2024  1058 Pt declined covid/flu testing [JD]    Clinical Course User Index [JD] Deigo Alonso, Eveleen Hinds, NP  Discussed exam findings and plan of care with patient, strict go to ER precautions given.   Patient verbalized understanding to this provider.  Ddx: Contact dermatitis, rash, viral uri with cough,allergies Final Clinical Impressions(s) / UC Diagnoses   Final diagnoses:  Contact dermatitis, unspecified contact dermatitis type, unspecified trigger  Rash and nonspecific skin eruption  Viral URI with cough     Discharge Instructions      Avoid heat ,hot water  as it makes rashes worse. Take meds as directed. Follow up with PCP. GO to Er for new or worsening issues.  Most likely you have a viral illness: no antibiotic as indicated at this time, May treat with OTC meds of choice. Make sure to drink plenty of fluids to stay hydrated(gatorade, water , popsicles,jello,etc), avoid caffeine products. Follow up with PCP. Return as needed.   ED Prescriptions     Medication Sig Dispense Auth. Provider   triamcinolone ointment (KENALOG) 0.5 % Apply 1 Application topically 2 (two) times daily for 7 days. To affected areas,avoid face 15 g Elieser Tetrick, Eveleen Hinds, NP      PDMP not reviewed this encounter.   Peter Brands,  NP 05/25/24 [phone]

## 2024-05-25 NOTE — ED Triage Notes (Signed)
 Patient states that he's had a rash on his left arm in the bend of the elbow for 4 days. Started spreading last night. Patient states that's he started having bodyaches-headache-cough-fatigue that started yesterday

## 2024-05-25 NOTE — Discharge Instructions (Signed)
 Avoid heat ,hot water  as it makes rashes worse. Take meds as directed. Follow up with PCP. GO to Er for new or worsening issues.  Most likely you have a viral illness: no antibiotic as indicated at this time, May treat with OTC meds of choice. Make sure to drink plenty of fluids to stay hydrated(gatorade, water , popsicles,jello,etc), avoid caffeine products. Follow up with PCP. Return as needed.

## 2024-06-05 ENCOUNTER — Encounter: Payer: Self-pay | Admitting: Internal Medicine

## 2024-06-05 ENCOUNTER — Ambulatory Visit: Admitting: Internal Medicine

## 2024-06-05 VITALS — BP 144/92 | HR 100 | Temp 98.4°F | Ht 71.0 in | Wt 236.8 lb

## 2024-06-05 DIAGNOSIS — J069 Acute upper respiratory infection, unspecified: Secondary | ICD-10-CM | POA: Diagnosis not present

## 2024-06-05 DIAGNOSIS — R052 Subacute cough: Secondary | ICD-10-CM

## 2024-06-05 MED ORDER — ALBUTEROL SULFATE HFA 108 (90 BASE) MCG/ACT IN AERS: 2.0000 | INHALATION_SPRAY | Freq: Four times a day (QID) | RESPIRATORY_TRACT | 2 refills | Status: AC | PRN

## 2024-06-05 MED ORDER — BENZONATATE 100 MG PO CAPS: 100.0000 mg | ORAL_CAPSULE | Freq: Two times a day (BID) | ORAL | 0 refills | Status: AC | PRN

## 2024-06-05 MED ORDER — METHYLPREDNISOLONE 4 MG PO TBPK: ORAL_TABLET | ORAL | 0 refills | Status: AC

## 2024-06-05 MED ORDER — AMOXICILLIN-POT CLAVULANATE 875-125 MG PO TABS: 1.0000 | ORAL_TABLET | Freq: Two times a day (BID) | ORAL | 0 refills | Status: AC

## 2024-06-05 NOTE — Progress Notes (Signed)
 Acute Office Visit  Subjective:     Patient ID: Martin Parks, male    DOB: Aug 18, 1975, 49 y.o.   MRN: [id number]  Chief Complaint  Patient presents with   Cough    Onset 2 weeks cough, headache, and had a rash.  Was seen at urgent care last week told a virus    Cough Associated symptoms include chills, a fever, a sore throat, shortness of breath and wheezing. Pertinent negatives include no ear pain.   Patient is in today for cough.    Discussed the use of AI scribe software for clinical note transcription with the patient, who gave verbal consent to proceed.  History of Present Illness Martin Parks is a 49 year old male who presents with persistent respiratory symptoms and rash.  Symptoms began with feeling run down on Saturday, worsening over the next few days. By Sunday and Monday, a rash developed on his arm, treated with a steroid cream. On Tuesday and Wednesday nights, he experienced fever and severe chills without measuring his temperature.  He has a persistent cough with clear sputum during the day and yellowish sputum in the mornings. Shortness of breath occurs especially when talking or moving, with decreased stamina during walks. Wheezing is noted, particularly at night. He experiences headaches, congestion, and sinus pain, using a NeoMed nasal rinse and Flonase daily. Symptoms worsen at night, affecting sleep. He has tried Robitussin and Nyquil for relief.  He feels chest tightness and has not used an inhaler before. He avoids Mucinex D due to its effect on blood pressure and is considering Coricidin for high blood pressure. He manages work from home when necessary.    Review of Systems  Constitutional:  Positive for chills, fever and malaise/fatigue.  HENT:  Positive for congestion, sinus pain and sore throat. Negative for ear pain.   Respiratory:  Positive for cough, sputum production, shortness of breath and wheezing.         Objective:    BP (!)  144/92   Pulse 100   Temp 98.4 F (36.9 C)   Ht 5\' 11"  (1.803 m)   Wt 236 lb 12.8 oz (107.4 kg)   SpO2 97%   BMI 33.03 kg/m  BP Readings from Last 3 Encounters:  06/05/24 (!) 144/92  05/25/24 136/88  03/25/24 132/79   Wt Readings from Last 3 Encounters:  06/05/24 236 lb 12.8 oz (107.4 kg)  03/25/24 230 lb (104.3 kg)  02/25/24 243 lb (110.2 kg)      Physical Exam Constitutional:      Appearance: Normal appearance.  HENT:     Head: Normocephalic and atraumatic.     Right Ear: Tympanic membrane, ear canal and external ear normal.     Left Ear: Tympanic membrane, ear canal and external ear normal.     Nose: Congestion present.     Mouth/Throat:     Mouth: Mucous membranes are moist.     Comments: Mild PND Eyes:     Conjunctiva/sclera: Conjunctivae normal.  Cardiovascular:     Rate and Rhythm: Normal rate and regular rhythm.  Pulmonary:     Effort: Pulmonary effort is normal.     Breath sounds: No wheezing, rhonchi or rales.     Comments: Decreased air movement throughout  Skin:    General: Skin is warm and dry.  Neurological:     General: No focal deficit present.     Mental Status: He is alert. Mental status is at baseline.  Psychiatric:  Mood and Affect: Mood normal.        Behavior: Behavior normal.     No results found for any visits on 06/05/24.      Assessment & Plan:   Assessment & Plan Viral Upper Respiratory Infection Symptoms suggest viral etiology with no bacterial infection. Explained lack of specific antiviral treatment for non-COVID or non-flu viruses. Emphasized rest and hydration. - Prescribed Medrol dose pack for sinus and airway inflammation. - Prescribed albuterol  inhaler every 4-6 hours as needed for shortness of breath or wheezing. - Prescribed stronger cough suppressant for nighttime use. - Recommended Coricidin for cold symptoms in hypertension. - Advised sinus rinses with nasal saline and Flonase for congestion. - Encouraged  rest, hydration, vitamin C, and zinc. - Provided conditional Augmentin  prescription if symptoms worsen, indicating bacterial infection.  - methylPREDNISolone (MEDROL DOSEPAK) 4 MG TBPK tablet; Use as directed.  Dispense: 21 each; Refill: 0 - albuterol  (VENTOLIN  HFA) 108 (90 Base) MCG/ACT inhaler; Inhale 2 puffs into the lungs every 6 (six) hours as needed for wheezing or shortness of breath.  Dispense: 8 g; Refill: 2 - benzonatate (TESSALON) 100 MG capsule; Take 1 capsule (100 mg total) by mouth 2 (two) times daily as needed for cough.  Dispense: 20 capsule; Refill: 0 - amoxicillin -clavulanate (AUGMENTIN ) 875-125 MG tablet; Take 1 tablet by mouth 2 (two) times daily for 5 days.  Dispense: 10 tablet; Refill: 0   Return if symptoms worsen or fail to improve.  Rockney Cid, DO

## 2024-06-23 DIAGNOSIS — G4733 Obstructive sleep apnea (adult) (pediatric): Secondary | ICD-10-CM | POA: Diagnosis not present

## 2024-06-29 DIAGNOSIS — G4733 Obstructive sleep apnea (adult) (pediatric): Secondary | ICD-10-CM | POA: Diagnosis not present

## 2024-07-27 DIAGNOSIS — R7989 Other specified abnormal findings of blood chemistry: Secondary | ICD-10-CM | POA: Diagnosis not present

## 2024-07-27 DIAGNOSIS — G4733 Obstructive sleep apnea (adult) (pediatric): Secondary | ICD-10-CM | POA: Diagnosis not present

## 2024-07-28 ENCOUNTER — Ambulatory Visit (INDEPENDENT_AMBULATORY_CARE_PROVIDER_SITE_OTHER): Admitting: Behavioral Health

## 2024-07-28 ENCOUNTER — Encounter: Payer: Self-pay | Admitting: Behavioral Health

## 2024-07-28 VITALS — BP 120/78 | HR 85 | Ht 71.0 in | Wt 234.0 lb

## 2024-07-28 DIAGNOSIS — F411 Generalized anxiety disorder: Secondary | ICD-10-CM

## 2024-07-28 DIAGNOSIS — F331 Major depressive disorder, recurrent, moderate: Secondary | ICD-10-CM

## 2024-07-28 MED ORDER — ESCITALOPRAM OXALATE 10 MG PO TABS: 10.0000 mg | ORAL_TABLET | Freq: Every day | ORAL | 1 refills | Status: DC

## 2024-07-28 MED ORDER — ALPRAZOLAM 0.5 MG PO TABS: 0.5000 mg | ORAL_TABLET | ORAL | 1 refills | Status: DC | PRN

## 2024-07-28 NOTE — Progress Notes (Signed)
 [facility] MD/PA/NP Initial Note  07/28/2024 3:38 PM Martin Parks  MRN:  [id number]  Chief Complaint:  Chief Complaint   Anxiety; Depression; Patient Education; Establish Care; Stress     HPI:   Martin Parks, 49 year old male presents to this office for initial visit and to establish care.  Collateral information should be considered reliable.  Patient is very articulate and talkative.  States that he has been suffering from anxiety and depression periodically stemming back to 20 11-2011.  He is attempted a couple of other psychotropic medications with limited success over the years.  Says that his depression in the last few months has returned.  Says he has some anxiety but feels like depression is worse this time.  Says that he cannot identify any specific triggers for depression or anxiety because he reports a relatively stable life.  He is interested today in restarting an antidepressant.  He is agreeable to completing GeneSight packet today for further reference.  His PHQ 2 was negative his MDQ had 8 of the 14 criteria on marked yes.  He endorses periods of hyperactivity, feeling much more confident than normal, racing thoughts, lack of concentration, and risky or foolish behaviors.  Numerically rates depression today at 4/10, and anxiety at 2/10.  He denies any current mania, no psychosis, no auditory or visual hallucinations or delirium.  Reports stable marriage and family life.  Currently lives at home with wife and 2 teenage children ages 58 and 63.  Endorses strong family support network.  Works full-time 40+ hours per week at [employer] in [place].  Denies SI or HI.  Past psychiatric medication trials: Prozac Pristiq Xanax      Visit Diagnosis:    ICD-10-CM   1. Generalized anxiety disorder  F41.1 escitalopram  (LEXAPRO ) 10 MG tablet    ALPRAZolam  (XANAX ) 0.5 MG tablet    2. Major depressive disorder, recurrent episode, moderate (HCC)  F33.1 escitalopram  (LEXAPRO ) 10 MG  tablet      Past Psychiatric History: Anxiety, MDD, ADD  Past Medical History:  Past Medical History:  Diagnosis Date   Anxiety    Complication of anesthesia    hard time waking up after hernia surgery   Depression    Dysplastic nevus    Back x 2, treated at Dr. Garth office, will request pathology   GERD (gastroesophageal reflux disease)    h/o    Headache    migraines   History of kidney stones    currently   Hypertension    h/o years ago due to anxiety-taken off bp meds once anxiety meds started helping   Pneumonia    h/o    Umbilical hernia without obstruction and without gangrene 12/22/2018    Past Surgical History:  Procedure Laterality Date   COLONOSCOPY WITH PROPOFOL  N/A 03/30/2022   Procedure: COLONOSCOPY WITH PROPOFOL ;  Surgeon: Therisa Bi, MD;  Location: [facility] ENDOSCOPY;  Service: Gastroenterology;  Laterality: N/A;   HERNIA REPAIR     inguinal   TONSILLECTOMY     UMBILICAL HERNIA REPAIR N/A 03/02/2019   Procedure: OPEN HERNIA REPAIR UMBILICAL ADULT WITH MESH;  Surgeon: Nicholaus Selinda Birmingham, MD;  Location: [facility] ORS;  Service: General;  Laterality: N/A;    Family Psychiatric History: none  Family History:  Family History  Problem Relation Age of Onset   Cancer Sister        breast    Social History:  Social History   Socioeconomic History   Marital status: Married    Spouse name: Burnard  Wester   Number of children: 2   Years of education: 16   Highest education level: Bachelor's degree (e.g., BA, AB, BS)  Occupational History   Not on file  Tobacco Use   Smoking status: Never   Smokeless tobacco: Never  Vaping Use   Vaping status: Never Used  Substance and Sexual Activity   Alcohol use: Not Currently    Comment: rare   Drug use: No   Sexual activity: Yes    Partners: Female  Other Topics Concern   Not on file  Social History Narrative   Lives in [place] with wife and two teenage children. Enjoys outdoor activities in free time.     Social Drivers of Corporate investment banker Strain: Low Risk  (02/25/2024)   Overall Financial Resource Strain (CARDIA)    Difficulty of Paying Living Expenses: Not hard at all  Food Insecurity: No Food Insecurity (02/25/2024)   Hunger Vital Sign    Worried About Running Out of Food in the Last Year: Never true    Ran Out of Food in the Last Year: Never true  Transportation Needs: No Transportation Needs (02/25/2024)   PRAPARE - Administrator, Civil Service (Medical): No    Lack of Transportation (Non-Medical): No  Physical Activity: Sufficiently Active (02/25/2024)   Exercise Vital Sign    Days of Exercise per Week: 3 days    Minutes of Exercise per Session: 60 min  Stress: No Stress Concern Present (02/25/2024)   [employer] of Occupational Health - Occupational Stress Questionnaire    Feeling of Stress : Only a little  Social Connections: Socially Integrated (02/25/2024)   Social Connection and Isolation Panel    Frequency of Communication with Friends and Family: More than three times a week    Frequency of Social Gatherings with Friends and Family: More than three times a week    Attends Religious Services: 1 to 4 times per year    Active Member of [employer] or Organizations: Yes    Attends Engineer, structural: More than 4 times per year    Marital Status: Married    Allergies:  Allergies  Allergen Reactions   Grass Extracts [Gramineae Pollens]    Adhesive [Tape] Rash    Metabolic Disorder Labs: Lab Results  Component Value Date   HGBA1C 5.7 (H) 02/25/2024   MPG 117 02/25/2024   MPG 117 02/18/2023   Lab Results  Component Value Date   PROLACTIN 3.9 04/13/2022   Lab Results  Component Value Date   CHOL 208 (H) 02/25/2024   TRIG 119 02/25/2024   HDL 40 02/25/2024   CHOLHDL 5.2 (H) 02/25/2024   LDLCALC 144 (H) 02/25/2024   LDLCALC 124 (H) 02/18/2023   Lab Results  Component Value Date   TSH 1.32 02/18/2023   TSH 1.67 12/22/2018     Therapeutic Level Labs: No results found for: LITHIUM No results found for: VALPROATE No results found for: CBMZ  Current Medications: Current Outpatient Medications  Medication Sig Dispense Refill   ALPRAZolam  (XANAX ) 0.5 MG tablet Take 1 tablet (0.5 mg total) by mouth as needed for anxiety. 30 tablet 1   escitalopram  (LEXAPRO ) 10 MG tablet Take 1 tablet (10 mg total) by mouth daily. 30 tablet 1   albuterol  (VENTOLIN  HFA) 108 (90 Base) MCG/ACT inhaler Inhale 2 puffs into the lungs every 6 (six) hours as needed for wheezing or shortness of breath. 8 g 2   ALPRAZolam  (XANAX ) 0.5 MG  tablet Take 0.5 mg by mouth 2 (two) times daily as needed for anxiety.   5   b complex vitamins capsule Take 1 capsule by mouth daily.     benzonatate  (TESSALON ) 100 MG capsule Take 1 capsule (100 mg total) by mouth 2 (two) times daily as needed for cough. 20 capsule 0   cholecalciferol (VITAMIN D3) 25 MCG (1000 UNIT) tablet Take 5,000 Units by mouth daily.     clomiPHENE  (CLOMID ) 50 MG tablet TAKE 1 TABLET(50 MG) BY MOUTH DAILY 30 tablet 3   hydrocortisone  2.5 % lotion Apply topically 3 (three) times a week. Apply to rash on chin 3 nights weekly, Tuesday, Thursday and Saturday 59 mL 6   ketoconazole  (NIZORAL ) 2 % cream Apply to the feet QHS and apply to the face QD on Monday, Wednesday, and Friday. 60 g 11   methylPREDNISolone  (MEDROL  DOSEPAK) 4 MG TBPK tablet Use as directed. 21 each 0   mometasone  (ELOCON ) 0.1 % cream Apply 1 Application topically daily as needed (Rash). 45 g 1   Omega-3 Fatty Acids (FISH OIL) 1000 MG CAPS Take 1,000 mg by mouth daily.      Turmeric 500 MG CAPS Take 500 mg by mouth daily.      No current facility-administered medications for this visit.    Medication Side Effects: none  Orders placed this visit:  No orders of the defined types were placed in this encounter.   Psychiatric Specialty Exam:  Review of Systems  Constitutional:  Positive for fatigue.  HENT:   Positive for sinus pressure and sinus pain.   Eyes:  Positive for redness.  Gastrointestinal:  Positive for abdominal pain and nausea.  Musculoskeletal:  Positive for back pain and neck pain.  Allergic/Immunologic: Negative.   Neurological:  Positive for headaches.    Blood pressure 120/78, pulse 85, height 5' 11 (1.803 m), weight 234 lb (106.1 kg).Body mass index is 32.64 kg/m.  General Appearance: Casual, Neat, and Well Groomed  Eye Contact:  Good  Speech:  Clear and Coherent and Talkative  Volume:  Normal  Mood:  Anxious, Depressed, and Dysphoric  Affect:  Congruent, Depressed, and Anxious  Thought Process:  Coherent  Orientation:  Full (Time, Place, and Person)  Thought Content: Logical   Suicidal Thoughts:  No  Homicidal Thoughts:  No  Memory:  WNL  Judgement:  Good  Insight:  Good  Psychomotor Activity:  Normal  Concentration:  Concentration: Good  Recall:  Good  Fund of Knowledge: Good  Language: Good  Assets:  Desire for Improvement  ADL's:  Intact  Cognition: WNL  Prognosis:  Good   Screenings:  GAD-7    Flowsheet Row Office Visit from 02/18/2023 in [facility] Office Visit from 12/22/2018 in [facility]  Total GAD-7 Score 5 19   PHQ2-9    Flowsheet Row Office Visit from 07/28/2024 in [facility] Office Visit from 02/25/2024 in [facility] Office Visit from 02/18/2023 in [facility] Office Visit from 04/13/2022 in [facility] Video Visit from 03/29/2022 in [facility]  PHQ-2 Total Score 1 0 0 0 0  PHQ-9 Total Score -- 2 0 0 0   Flowsheet Row UC from 05/25/2024 in [facility]  ED from 03/25/2024 in [facility] Emergency Department at [facility] Admission (Discharged) from 03/30/2022 in [facility]  C-SSRS RISK  CATEGORY No  Risk No Risk No Risk    Receiving Psychotherapy: No   Treatment Plan/Recommendations:  Greater than 50% of  60 min face to face time with patient was spent on counseling and coordination of care. We discussed his long term problems with anxiety and depression stemming back to 20 11-2011.  We discussed previous plan of care along with prior medication trials.  We conducted GeneSight packet today and discussed possible medications along with side effect profiles.  He was comfortable with starting new medication today and then revisiting for follow-up in 3 weeks.  We agreed to: Will start Lexapro  10 mg daily in the a.m. Will report worsening symptoms or side effects promptly To follow-up in 3 weeks to reassess Provided emergency contact information Reviewed PDMP      Redell DELENA Pizza, NP

## 2024-07-29 ENCOUNTER — Other Ambulatory Visit: Payer: Self-pay

## 2024-07-29 DIAGNOSIS — F411 Generalized anxiety disorder: Secondary | ICD-10-CM

## 2024-07-29 MED ORDER — ALPRAZOLAM 0.5 MG PO TABS: 0.5000 mg | ORAL_TABLET | Freq: Every day | ORAL | 1 refills | Status: DC | PRN

## 2024-08-11 ENCOUNTER — Encounter: Payer: Self-pay | Admitting: Behavioral Health

## 2024-08-21 ENCOUNTER — Other Ambulatory Visit: Payer: Self-pay | Admitting: Urology

## 2024-08-25 ENCOUNTER — Ambulatory Visit (INDEPENDENT_AMBULATORY_CARE_PROVIDER_SITE_OTHER): Admitting: Behavioral Health

## 2024-08-25 ENCOUNTER — Encounter: Payer: Self-pay | Admitting: Behavioral Health

## 2024-08-25 DIAGNOSIS — F331 Major depressive disorder, recurrent, moderate: Secondary | ICD-10-CM

## 2024-08-25 DIAGNOSIS — F411 Generalized anxiety disorder: Secondary | ICD-10-CM

## 2024-08-25 MED ORDER — ESCITALOPRAM OXALATE 10 MG PO TABS: 10.0000 mg | ORAL_TABLET | Freq: Every day | ORAL | 1 refills | Status: DC

## 2024-08-25 MED ORDER — ALPRAZOLAM 0.5 MG PO TABS: 0.5000 mg | ORAL_TABLET | Freq: Every day | ORAL | 1 refills | Status: DC | PRN

## 2024-08-25 NOTE — Progress Notes (Signed)
 [facility] Med Check  Patient ID: Martin Parks,  MRN: [id number]  PCP: Leavy Mole, PA-C  Date of Evaluation: 08/25/2024 Time spent:0 minutes  Chief Complaint:  Chief Complaint   Anxiety; Depression; Follow-up; Medication Problem; Patient Education     HISTORY/CURRENT STATUS: HPI  Martin Parks, 49 year old male presents to this office for initial visit and to establish care.  Collateral information should be considered reliable.  Says that he got a late start on taking the Lexapro . Had trouble at the pharmacy getting medication.  Did experience some gastric distress and mild headaches over the last 3 week.  We reviewed GeneSight results and he would like to continue with medication longer period to see if it will be effective. He has used about 1/2 of his xanax  since last visit and is using responsibly.  Numerically rates depression today at 3/10, and anxiety at 2/10.  He denies any current mania, no psychosis, no auditory or visual hallucinations or delirium.  Reports stable marriage and family life.  Currently lives at home with wife and 2 teenage children ages 7 and 37.  Endorses strong family support network.  Works full-time 40+ hours per week at [employer] in [place].  Denies SI or HI.   Past psychiatric medication trials: Prozac Pristiq Xanax             Individual Medical History/ Review of Systems: Changes? :No   Allergies: Grass extracts [gramineae pollens] and Adhesive [tape]  Current Medications:  Current Outpatient Medications:    albuterol  (VENTOLIN  HFA) 108 (90 Base) MCG/ACT inhaler, Inhale 2 puffs into the lungs every 6 (six) hours as needed for wheezing or shortness of breath., Disp: 8 g, Rfl: 2   ALPRAZolam  (XANAX ) 0.5 MG tablet, Take 0.5 mg by mouth 2 (two) times daily as needed for anxiety. , Disp: , Rfl: 5   ALPRAZolam  (XANAX ) 0.5 MG tablet, Take 1 tablet (0.5 mg total) by mouth daily as needed for anxiety., Disp: 30 tablet, Rfl: 1   b  complex vitamins capsule, Take 1 capsule by mouth daily., Disp: , Rfl:    benzonatate  (TESSALON ) 100 MG capsule, Take 1 capsule (100 mg total) by mouth 2 (two) times daily as needed for cough., Disp: 20 capsule, Rfl: 0   cholecalciferol (VITAMIN D3) 25 MCG (1000 UNIT) tablet, Take 5,000 Units by mouth daily., Disp: , Rfl:    CLOMID  50 MG tablet, TAKE 1 TABLET(50 MG) BY MOUTH DAILY, Disp: 30 tablet, Rfl: 3   escitalopram  (LEXAPRO ) 10 MG tablet, Take 1 tablet (10 mg total) by mouth daily., Disp: 30 tablet, Rfl: 1   hydrocortisone  2.5 % lotion, Apply topically 3 (three) times a week. Apply to rash on chin 3 nights weekly, Tuesday, Thursday and Saturday, Disp: 59 mL, Rfl: 6   ketoconazole  (NIZORAL ) 2 % cream, Apply to the feet QHS and apply to the face QD on Monday, Wednesday, and Friday., Disp: 60 g, Rfl: 11   methylPREDNISolone  (MEDROL  DOSEPAK) 4 MG TBPK tablet, Use as directed., Disp: 21 each, Rfl: 0   mometasone  (ELOCON ) 0.1 % cream, Apply 1 Application topically daily as needed (Rash)., Disp: 45 g, Rfl: 1   Omega-3 Fatty Acids (FISH OIL) 1000 MG CAPS, Take 1,000 mg by mouth daily. , Disp: , Rfl:    Turmeric 500 MG CAPS, Take 500 mg by mouth daily. , Disp: , Rfl:  Medication Side Effects: none  Family Medical/ Social History: Changes? No  MENTAL HEALTH EXAM:  There were no vitals taken for this visit.There is  no height or weight on file to calculate BMI.  General Appearance: Casual, Neat, and Well Groomed  Eye Contact:  Good  Speech:  Clear and Coherent  Volume:  Normal  Mood:  NA  Affect:  Appropriate  Thought Process:  Coherent  Orientation:  Full (Time, Place, and Person)  Thought Content: Logical   Suicidal Thoughts:  No  Homicidal Thoughts:  No  Memory:  WNL  Judgement:  Good  Insight:  Good  Psychomotor Activity:  Normal  Concentration:  Concentration: Good  Recall:  Good  Fund of Knowledge: Good  Language: Good  Assets:  Desire for Improvement  ADL's:  Intact  Cognition:  WNL  Prognosis:  Good    DIAGNOSES:    ICD-10-CM   1. Generalized anxiety disorder  F41.1 ALPRAZolam  (XANAX ) 0.5 MG tablet    escitalopram  (LEXAPRO ) 10 MG tablet    2. Major depressive disorder, recurrent episode, moderate (HCC)  F33.1 escitalopram  (LEXAPRO ) 10 MG tablet      Receiving Psychotherapy: No    RECOMMENDATIONS:   Greater than 50% of  60 min face to face time with patient was spent on counseling and coordination of care. We discussed his minimal improvement with anxiety and depression thus far. He only started his Lexapro  3 weeks ago and understands the medication needs more time to work. We did review Genesight today and Lexapro  was listed in the caution column, indicating lower doses may be required. He would like to continue for now. Copy was provided to patient   We agreed to: To continue  Lexapro  10 mg daily in the a.m. To continue Xanax  0.5 mg daily as needed for more severe anxiety Will report worsening symptoms or side effects promptly To follow-up in 3 weeks to reassess Provided emergency contact information Discussed potential benefits, risk, and side effects of benzodiazepines to include potential risk of tolerance and dependence, as well as possible drowsiness.  Advised patient not to drive if experiencing drowsiness and to take lowest possible effective dose to minimize risk of dependence and tolerance.  Reviewed PDMP      Redell DELENA Pizza, NP

## 2024-09-10 ENCOUNTER — Ambulatory Visit: Payer: BC Managed Care – PPO | Admitting: Dermatology

## 2024-09-10 ENCOUNTER — Telehealth: Payer: Self-pay

## 2024-09-10 ENCOUNTER — Encounter: Payer: Self-pay | Admitting: Dermatology

## 2024-09-10 DIAGNOSIS — W908XXA Exposure to other nonionizing radiation, initial encounter: Secondary | ICD-10-CM | POA: Diagnosis not present

## 2024-09-10 DIAGNOSIS — D485 Neoplasm of uncertain behavior of skin: Secondary | ICD-10-CM | POA: Diagnosis not present

## 2024-09-10 DIAGNOSIS — Z1283 Encounter for screening for malignant neoplasm of skin: Secondary | ICD-10-CM

## 2024-09-10 DIAGNOSIS — D1801 Hemangioma of skin and subcutaneous tissue: Secondary | ICD-10-CM

## 2024-09-10 DIAGNOSIS — D229 Melanocytic nevi, unspecified: Secondary | ICD-10-CM

## 2024-09-10 DIAGNOSIS — L578 Other skin changes due to chronic exposure to nonionizing radiation: Secondary | ICD-10-CM

## 2024-09-10 DIAGNOSIS — L918 Other hypertrophic disorders of the skin: Secondary | ICD-10-CM | POA: Diagnosis not present

## 2024-09-10 DIAGNOSIS — Z86018 Personal history of other benign neoplasm: Secondary | ICD-10-CM

## 2024-09-10 DIAGNOSIS — D492 Neoplasm of unspecified behavior of bone, soft tissue, and skin: Secondary | ICD-10-CM

## 2024-09-10 DIAGNOSIS — L821 Other seborrheic keratosis: Secondary | ICD-10-CM

## 2024-09-10 DIAGNOSIS — L814 Other melanin hyperpigmentation: Secondary | ICD-10-CM

## 2024-09-10 NOTE — Telephone Encounter (Signed)
 Patient's biopsy results from [facility] in media. aw

## 2024-09-10 NOTE — Progress Notes (Signed)
 Follow-Up Visit   Subjective  Martin Parks is a 49 y.o. male who presents for the following: Skin Cancer Screening and Full Body Skin Exam. Hx of dysplastic nevi.   The patient presents for Total-Body Skin Exam (TBSE) for skin cancer screening and mole check. The patient has spots, moles and lesions to be evaluated, some may be new or changing and the patient may have concern these could be cancer.  The following portions of the chart were reviewed this encounter and updated as appropriate: medications, allergies, medical history  Review of Systems:  No other skin or systemic complaints except as noted in HPI or Assessment and Plan.  Objective  Well appearing patient in no apparent distress; mood and affect are within normal limits.  A full examination was performed including scalp, head, eyes, ears, nose, lips, neck, chest, axillae, abdomen, back, buttocks, bilateral upper extremities, bilateral lower extremities, hands, feet, fingers, toes, fingernails, and toenails. All findings within normal limits unless otherwise noted below.   Relevant physical exam findings are noted in the Assessment and Plan.  Left lower abdomen at waistline 0.8 cm fleshy pedunculated papule   Assessment & Plan   SKIN CANCER SCREENING PERFORMED TODAY.  HISTORY OF DYSPLASTIC NEVI removed at  Dr. Arlyss' Office in past.   2015 -right superior medial mid back -mild dysplastic nevus excised 2019 -medial scapula back -moderate dysplastic -excised See path reports requested from Dr. Sheri office today in media in patient's chart entered in media 09/10/2024 No evidence of recurrence today Recommend regular full body skin exams Recommend daily broad spectrum sunscreen SPF 30+ to sun-exposed areas, reapply every 2 hours as needed.  Call if any new or changing lesions are noted between office visits   ACTINIC DAMAGE - Chronic condition, secondary to cumulative UV/sun exposure - diffuse scaly  erythematous macules with underlying dyspigmentation - Recommend daily broad spectrum sunscreen SPF 30+ to sun-exposed areas, reapply every 2 hours as needed.  - Staying in the shade or wearing long sleeves, sun glasses (UVA+UVB protection) and wide brim hats (4-inch brim around the entire circumference of the hat) are also recommended for sun protection.  - Call for new or changing lesions.  LENTIGINES, SEBORRHEIC KERATOSES, HEMANGIOMAS - Benign normal skin lesions - Benign-appearing - Call for any changes  MELANOCYTIC NEVI - Tan-brown and/or pink-flesh-colored symmetric macules and papules - Benign appearing on exam today - Observation - Call clinic for new or changing moles - Recommend daily use of broad spectrum spf 30+ sunscreen to sun-exposed areas.    Acrochordons (Skin Tags) - Fleshy, skin-colored pedunculated papules at neck - Benign appearing.  - Observe. - If desired, they can be removed with an in office procedure that is not covered by insurance. - Please call the clinic if you notice any new or changing lesions.    NEOPLASM OF SKIN Left lower abdomen at waistline Epidermal / dermal shaving  Lesion diameter (cm):  0.8 Informed consent: discussed and consent obtained   Timeout: patient name, date of birth, surgical site, and procedure verified   Procedure prep:  Patient was prepped and draped in usual sterile fashion Prep type:  Isopropyl alcohol Anesthesia: the lesion was anesthetized in a standard fashion   Anesthetic:  1% lidocaine  w/ epinephrine  1-100,000 buffered w/ 8.4% NaHCO3 Instrument used: scissors   Hemostasis achieved with: pressure, aluminum chloride and electrodesiccation   Outcome: patient tolerated procedure well   Post-procedure details: sterile dressing applied and wound care instructions given   Dressing type:  bandage and petrolatum    Specimen 1 - Surgical pathology Differential Diagnosis: Irritated nevus vs irritated acrochordon  Check  Margins: No ACTINIC SKIN DAMAGE   LENTIGO   MELANOCYTIC NEVUS, UNSPECIFIED LOCATION   HISTORY OF DYSPLASTIC NEVUS   SKIN CANCER SCREENING   SKIN TAGS, MULTIPLE ACQUIRED   Return in about 1 year (around 09/10/2025) for TBSE, HxDN.  I, Jill Parcell, CMA, am acting as scribe for Alm Rhyme, MD.   Documentation: I have reviewed the above documentation for accuracy and completeness, and I agree with the above.  Alm Rhyme, MD

## 2024-09-10 NOTE — Patient Instructions (Addendum)

## 2024-09-14 LAB — SURGICAL PATHOLOGY

## 2024-09-15 ENCOUNTER — Ambulatory Visit: Payer: Self-pay | Admitting: Dermatology

## 2024-09-15 NOTE — Telephone Encounter (Signed)
-----   Message from Alm Rhyme sent at 09/15/2024 11:17 AM EDT ----- FINAL DIAGNOSIS        1. Skin, left lower abdomen at waistline :       ACROCHORDON   Benign skin tag No further treatment needed ----- Message ----- From: Interface, Lab In Three Zero One Sent: 09/14/2024   6:10 PM EDT To: Alm JAYSON Rhyme, MD

## 2024-09-15 NOTE — Telephone Encounter (Signed)
LM on VM please return my call, NF

## 2024-09-16 NOTE — Telephone Encounter (Signed)
 Patient advised of BX results. aw

## 2024-09-23 ENCOUNTER — Ambulatory Visit: Admitting: Behavioral Health

## 2024-09-23 ENCOUNTER — Encounter: Payer: Self-pay | Admitting: Behavioral Health

## 2024-09-23 DIAGNOSIS — F331 Major depressive disorder, recurrent, moderate: Secondary | ICD-10-CM

## 2024-09-23 DIAGNOSIS — F411 Generalized anxiety disorder: Secondary | ICD-10-CM

## 2024-09-23 MED ORDER — ESCITALOPRAM OXALATE 10 MG PO TABS: 10.0000 mg | ORAL_TABLET | Freq: Every day | ORAL | 2 refills | Status: DC

## 2024-09-23 MED ORDER — ALPRAZOLAM 0.5 MG PO TABS: 0.5000 mg | ORAL_TABLET | Freq: Every day | ORAL | 2 refills | Status: AC | PRN

## 2024-09-23 NOTE — Progress Notes (Signed)
 [facility]  Patient ID: Martin Parks,  MRN: [id number]  PCP: Leavy Mole, PA-C  Date of Evaluation: 09/23/2024 Time spent:20 minutes  Chief Complaint:  Chief Complaint   Anxiety; Depression; Follow-up; Medication Refill; Patient Education     HISTORY/CURRENT STATUS: HPI Martin Parks, 49 year old male presents to this office for initial visit and to establish care.  Collateral information should be considered reliable.  Says that he has improved about 50% so far. Still having some problems with intermittent headaches and some sleep disturbance. However, he would like to give more time for the Lexapro  to work.  Request follow up in 2 months. He would then like to make decision on whether it may be beneficial to switch medication. He has used about 1/2 of his xanax  since last visit and is using responsibly.  Numerically rates depression today at 3/10, and anxiety at 2/10.  He denies any current mania, no psychosis, no auditory or visual hallucinations or delirium.  Reports stable marriage and family life.  Currently lives at home with wife and 2 teenage children ages 41 and 41.  Endorses strong family support network.  Works full-time 40+ hours per week at [employer] in [place].  Denies SI or HI.   Past psychiatric medication trials: Prozac Pristiq Xanax           Individual Medical History/ Review of Systems: Changes? :No   Allergies: Grass extracts [gramineae pollens] and Adhesive [tape]  Current Medications:  Current Outpatient Medications:    albuterol  (VENTOLIN  HFA) 108 (90 Base) MCG/ACT inhaler, Inhale 2 puffs into the lungs every 6 (six) hours as needed for wheezing or shortness of breath., Disp: 8 g, Rfl: 2   ALPRAZolam  (XANAX ) 0.5 MG tablet, Take 0.5 mg by mouth 2 (two) times daily as needed for anxiety. , Disp: , Rfl: 5   ALPRAZolam  (XANAX ) 0.5 MG tablet, Take 1 tablet (0.5 mg total) by mouth daily as needed for anxiety., Disp: 30 tablet, Rfl: 2   b  complex vitamins capsule, Take 1 capsule by mouth daily., Disp: , Rfl:    benzonatate  (TESSALON ) 100 MG capsule, Take 1 capsule (100 mg total) by mouth 2 (two) times daily as needed for cough., Disp: 20 capsule, Rfl: 0   cholecalciferol (VITAMIN D3) 25 MCG (1000 UNIT) tablet, Take 5,000 Units by mouth daily., Disp: , Rfl:    CLOMID  50 MG tablet, TAKE 1 TABLET(50 MG) BY MOUTH DAILY, Disp: 30 tablet, Rfl: 3   escitalopram  (LEXAPRO ) 10 MG tablet, Take 1 tablet (10 mg total) by mouth daily., Disp: 30 tablet, Rfl: 2   hydrocortisone  2.5 % lotion, Apply topically 3 (three) times a week. Apply to rash on chin 3 nights weekly, Tuesday, Thursday and Saturday, Disp: 59 mL, Rfl: 6   ketoconazole  (NIZORAL ) 2 % cream, Apply to the feet QHS and apply to the face QD on Monday, Wednesday, and Friday., Disp: 60 g, Rfl: 11   methylPREDNISolone  (MEDROL  DOSEPAK) 4 MG TBPK tablet, Use as directed., Disp: 21 each, Rfl: 0   mometasone  (ELOCON ) 0.1 % cream, Apply 1 Application topically daily as needed (Rash)., Disp: 45 g, Rfl: 1   Omega-3 Fatty Acids (FISH OIL) 1000 MG CAPS, Take 1,000 mg by mouth daily. , Disp: , Rfl:    Turmeric 500 MG CAPS, Take 500 mg by mouth daily. , Disp: , Rfl:  Medication Side Effects: none  Family Medical/ Social History: Changes? No  MENTAL HEALTH EXAM:  There were no vitals taken for this visit.There is no  height or weight on file to calculate BMI.  General Appearance: Casual, Neat, and Well Groomed  Eye Contact:  Good  Speech:  Clear and Coherent  Volume:  Normal  Mood:  NA  Affect:  Appropriate  Thought Process:  Coherent  Orientation:  Full (Time, Place, and Person)  Thought Content: Logical   Suicidal Thoughts:  No  Homicidal Thoughts:  No  Memory:  WNL  Judgement:  Good  Insight:  Good  Psychomotor Activity:  Normal  Concentration:  Concentration: Good  Recall:  Good  Fund of Knowledge: Good  Language: Good  Assets:  Desire for Improvement  ADL's:  Intact  Cognition:  WNL  Prognosis:  Good    DIAGNOSES:    ICD-10-CM   1. Generalized anxiety disorder  F41.1 ALPRAZolam  (XANAX ) 0.5 MG tablet    escitalopram  (LEXAPRO ) 10 MG tablet    2. Major depressive disorder, recurrent episode, moderate (HCC)  F33.1 escitalopram  (LEXAPRO ) 10 MG tablet      Receiving Psychotherapy: No    RECOMMENDATIONS:   Greater than 50% of  60 min face to face time with patient was spent on counseling and coordination of care. We discussed his moderate improvement with anxiety and depression thus far. Reports about 50% improvement.  He would like to continue for now for a couple more months and then make decision if he would like to change medications. Lexapro   was indicated as medication that may be necessary to use lower doses. He has experienced some problems with headaches and sleep, but not sure it is the medication at this time.    We agreed to: To continue  Lexapro  10 mg daily in the a.m. To continue Xanax  0.5 mg daily as needed for more severe anxiety Will report worsening symptoms or side effects promptly To follow-up in 8 weeks to reassess Provided emergency contact information Discussed potential benefits, risk, and side effects of benzodiazepines to include potential risk of tolerance and dependence, as well as possible drowsiness.  Advised patient not to drive if experiencing drowsiness and to take lowest possible effective dose to minimize risk of dependence and tolerance.  Reviewed PDMP     Redell DELENA Pizza, NP

## 2024-10-15 ENCOUNTER — Other Ambulatory Visit: Payer: Self-pay

## 2024-10-15 DIAGNOSIS — E291 Testicular hypofunction: Secondary | ICD-10-CM

## 2024-10-16 ENCOUNTER — Other Ambulatory Visit

## 2024-10-16 DIAGNOSIS — E291 Testicular hypofunction: Secondary | ICD-10-CM

## 2024-10-17 LAB — HEMOGLOBIN AND HEMATOCRIT, BLOOD
Hematocrit: 45 % (ref 37.5–51.0)
Hemoglobin: 14.6 g/dL (ref 13.0–17.7)

## 2024-10-17 LAB — TESTOSTERONE: Testosterone: 554 ng/dL (ref 264–916)

## 2024-10-20 ENCOUNTER — Other Ambulatory Visit

## 2024-10-23 ENCOUNTER — Ambulatory Visit: Admitting: Urology

## 2024-10-23 ENCOUNTER — Encounter: Payer: Self-pay | Admitting: Urology

## 2024-10-23 VITALS — BP 128/78 | HR 80 | Ht 71.0 in | Wt 225.0 lb

## 2024-10-23 DIAGNOSIS — E291 Testicular hypofunction: Secondary | ICD-10-CM | POA: Diagnosis not present

## 2024-10-23 NOTE — Progress Notes (Signed)
 10/23/2024 8:19 AM   Martin Parks 1975-02-21 [id number]  Referring provider: Leavy Mole, PA-C [address] [place],  [place] [address]  Chief Complaint  Patient presents with   Hypogonadism    Urologic history: 1.  Hypogonadism Initial visit 05/12/2023; testosterone  140 ng/dL Complaints tiredness/fatigue, decreased libido, mild ED LH level low normal Elected Clomid   HPI: Martin Parks is a 49 y.o. male presents for annual follow-up  No problems since last visit; good energy level, libido Clomid  dose increased to 50 mg daily last year and his symptoms have improved Labs 10/16/2024: Testosterone  554 ng/dL, H/H 85.3/54.9 Comprehensive metabolic panel 02/2024 with normal LFTs   PMH: Past Medical History:  Diagnosis Date   Anxiety    Complication of anesthesia    hard time waking up after hernia surgery   Depression    Dysplastic nevus 04/19/2014   Right superior medial mid back. Mild, edges involved. Excised 05/10/2014, no residual DN.   Dysplastic nevus 03/31/2018   Medial scapular back. Moderate, edges free. Excised 06/09/2018, no residual DN.   GERD (gastroesophageal reflux disease)    h/o    Headache    migraines   History of kidney stones    currently   Hypertension    h/o years ago due to anxiety-taken off bp meds once anxiety meds started helping   Pneumonia    h/o    Umbilical hernia without obstruction and without gangrene 12/22/2018    Surgical History: Past Surgical History:  Procedure Laterality Date   COLONOSCOPY WITH PROPOFOL  N/A 03/30/2022   Procedure: COLONOSCOPY WITH PROPOFOL ;  Surgeon: Therisa Bi, MD;  Location[facility] ENDOSCOPY;  Service: Gastroenterology;  Laterality: N/A;   HERNIA REPAIR     inguinal   TONSILLECTOMY     UMBILICAL HERNIA REPAIR N/A 03/02/2019   Procedure: OPEN HERNIA REPAIR UMBILICAL ADULT WITH MESH;  Surgeon: Nicholaus Selinda Birmingham, MD;  Location: [facility] ORS;  Service: General;  Laterality: N/A;    Home  Medications:  Allergies as of 10/23/2024       Reactions   Grass Extracts [gramineae Pollens]    Adhesive [tape] Rash        Medication List        Accurate as of October 23, 2024  8:19 AM. If you have any questions, ask your nurse or doctor.          albuterol  108 (90 Base) MCG/ACT inhaler Commonly known as: VENTOLIN  HFA Inhale 2 puffs into the lungs every 6 (six) hours as needed for wheezing or shortness of breath.   ALPRAZolam  0.5 MG tablet Commonly known as: Xanax  Take 1 tablet (0.5 mg total) by mouth daily as needed for anxiety. What changed: Another medication with the same name was removed. Continue taking this medication, and follow the directions you see here. Changed by: Glendia JAYSON Barba   b complex vitamins capsule Take 1 capsule by mouth daily.   benzonatate  100 MG capsule Commonly known as: TESSALON  Take 1 capsule (100 mg total) by mouth 2 (two) times daily as needed for cough.   cholecalciferol 25 MCG (1000 UNIT) tablet Commonly known as: VITAMIN D3 Take 5,000 Units by mouth daily.   Clomid  50 MG tablet Generic drug: clomiPHENE  TAKE 1 TABLET(50 MG) BY MOUTH DAILY   escitalopram  10 MG tablet Commonly known as: Lexapro  Take 1 tablet (10 mg total) by mouth daily.   Fish Oil 1000 MG Caps Take 1,000 mg by mouth daily.   hydrocortisone  2.5 % lotion Apply topically  3 (three) times a week. Apply to rash on chin 3 nights weekly, Tuesday, Thursday and Saturday   ketoconazole  2 % cream Commonly known as: NIZORAL  Apply to the feet QHS and apply to the face QD on Monday, Wednesday, and Friday.   methylPREDNISolone  4 MG Tbpk tablet Commonly known as: MEDROL  DOSEPAK Use as directed.   mometasone  0.1 % cream Commonly known as: ELOCON  Apply 1 Application topically daily as needed (Rash).   Turmeric 500 MG Caps Take 500 mg by mouth daily.        Allergies:  Allergies  Allergen Reactions   Grass Extracts [Gramineae Pollens]    Adhesive [Tape]  Rash    Family History: Family History  Problem Relation Age of Onset   Cancer Sister        breast    Social History:  reports that he has never smoked. He has never used smokeless tobacco. He reports that he does not currently use alcohol. He reports that he does not use drugs.   Physical Exam: BP 128/78   Pulse 80   Ht 5' 11 (1.803 m)   Wt 225 lb (102.1 kg)   BMI 31.38 kg/m   Constitutional:  Alert, No acute distress. HEENT: [place] AT Respiratory: Normal respiratory effort, no increased work of breathing. Psychiatric: Normal mood and affect.   Assessment & Plan:    1.  Hypogonadism Stable testosterone  level Will move to annual follow-up with testosterone , H/H   Glendia JAYSON Barba, MD  [facility] [address], [address] [place], [place] [address] [phone]

## 2024-10-25 ENCOUNTER — Other Ambulatory Visit: Payer: Self-pay | Admitting: Urology

## 2024-10-30 DIAGNOSIS — M7502 Adhesive capsulitis of left shoulder: Secondary | ICD-10-CM | POA: Diagnosis not present

## 2024-10-30 DIAGNOSIS — M7542 Impingement syndrome of left shoulder: Secondary | ICD-10-CM | POA: Diagnosis not present

## 2024-11-09 ENCOUNTER — Other Ambulatory Visit: Payer: Self-pay

## 2024-11-09 ENCOUNTER — Ambulatory Visit: Attending: Orthopedic Surgery | Admitting: Physical Therapy

## 2024-11-09 DIAGNOSIS — M25512 Pain in left shoulder: Secondary | ICD-10-CM | POA: Diagnosis not present

## 2024-11-09 DIAGNOSIS — M25612 Stiffness of left shoulder, not elsewhere classified: Secondary | ICD-10-CM | POA: Diagnosis not present

## 2024-11-09 NOTE — Therapy (Signed)
 OUTPATIENT PHYSICAL THERAPY SHOULDER EVALUATION   Patient Name: Martin Parks MRN: [id number] DOB:October 25, 1975, 49 y.o., male Today's Date: 11/09/2024  END OF SESSION:  PT End of Session - 11/09/24 1126     Visit Number 1    Number of Visits 8    Date for Recertification  01/04/25    PT Start Time 1100    PT Stop Time 1120    PT Time Calculation (min) 20 min    Activity Tolerance Patient tolerated treatment well    Behavior During Therapy [facility] for tasks assessed/performed          Past Medical History:  Diagnosis Date   Anxiety    Complication of anesthesia    hard time waking up after hernia surgery   Depression    Dysplastic nevus 04/19/2014   Right superior medial mid back. Mild, edges involved. Excised 05/10/2014, no residual DN.   Dysplastic nevus 03/31/2018   Medial scapular back. Moderate, edges free. Excised 06/09/2018, no residual DN.   GERD (gastroesophageal reflux disease)    h/o    Headache    migraines   History of kidney stones    currently   Hypertension    h/o years ago due to anxiety-taken off bp meds once anxiety meds started helping   Pneumonia    h/o    Umbilical hernia without obstruction and without gangrene 12/22/2018   Past Surgical History:  Procedure Laterality Date   COLONOSCOPY WITH PROPOFOL  N/A 03/30/2022   Procedure: COLONOSCOPY WITH PROPOFOL ;  Surgeon: Therisa Bi, MD;  Location[facility] ENDOSCOPY;  Service: Gastroenterology;  Laterality: N/A;   HERNIA REPAIR     inguinal   TONSILLECTOMY     UMBILICAL HERNIA REPAIR N/A 03/02/2019   Procedure: OPEN HERNIA REPAIR UMBILICAL ADULT WITH MESH;  Surgeon: Nicholaus Selinda Birmingham, MD;  Location: [facility] ORS;  Service: General;  Laterality: N/A;   Patient Active Problem List   Diagnosis Date Noted   Contact dermatitis 05/25/2024   Rash and nonspecific skin eruption 05/25/2024   Viral URI with cough 05/25/2024   Class 2 severe obesity with serious comorbidity and body mass index (BMI) of 35.0 to 35.9  in adult 02/09/2021   OSA on CPAP 06/05/2020   Prediabetes 02/10/2020   Hyperactivity 12/22/2018   Generalized anxiety disorder 12/22/2018   Hyperlipidemia LDL goal <100 12/22/2018   REFERRING PROVIDER: Jerel Madrid MD  REFERRING DIAG: Impingement of left shoulder, adhesive capsulitis.  THERAPY DIAG:  Acute pain of left shoulder  Stiffness of left shoulder, not elsewhere classified  Rationale for Evaluation and Treatment: Rehabilitation  ONSET DATE: ~2-3 months ago.  SUBJECTIVE:  SUBJECTIVE STATEMENT: The patient presents to the clinic with c/o left shoulder pain that came on about 2-3 months ago likely related to doing some work in his attic.  His pain is rated at 5-6/10.  He has had massage and Chiropractic work  that has been helpful.    PERTINENT HISTORY: See above.    PAIN:  Are you having pain? Yes: NPRS scale: 5-6/10. Pain location: Left shoulder. Pain description: Ache, throb, sharp.   Aggravating factors: Movement. Relieving factors: Rest.    PRECAUTIONS: None  RED FLAGS: None   WEIGHT BEARING RESTRICTIONS: No  FALLS:  Has patient fallen in last 6 months? No  LIVING ENVIRONMENT: Lives in: House/apartment Has following equipment at home: None  PLOF: Independent  PATIENT GOALS:Less pain and get back to weight lifting.    NEXT MD VISIT:   OBJECTIVE:   PATIENT SURVEYS:  QUICKDASH:  32 points (47.7%).    POSTURE: Rounded shoulders.  UPPER EXTREMITY ROM:   Left shoulder active antigravity flexion to 155 degrees (right is 180 degrees), ER is 80 degrees and behind back to L4.    UPPER EXTREMITY MMT:  Normal left shoulder strength.    SHOULDER SPECIAL TESTS: Some pain reproduction with a left shoulder Impingement test.  Normal UE DTR's.  PALPATION:  Palpable pain in  left acromial ridge region.                                                                                                                               TREATMENT DATE:    PATIENT EDUCATION: Education details: Instruct in sdly with 2# and ER with red theraband.   Person educated: Patient Education method:  Education comprehension:   HOME EXERCISE PROGRAM: As above.    ASSESSMENT:  CLINICAL IMPRESSION: The patient presents to OPPT with c/o left shoulder pain over the last two to three months.  He exhibits some loss of range of motion.  His strength is normal.  He has some pain with an impingement test.  His QUICKDASH score is 32 points (47.7%).  He has palpable pain in left acromial ridge region.  Patient will benefit from skilled physical therapy intervention to address pain and deficits.   OBJECTIVE IMPAIRMENTS: decreased activity tolerance, decreased ROM, increased muscle spasms, and pain.   ACTIVITY LIMITATIONS: carrying, lifting, reach over head, and reach behind back.  PARTICIPATION LIMITATIONS: cleaning  PERSONAL FACTORS: Time since onset of injury/illness/exacerbation are also affecting patient's functional outcome.   REHAB POTENTIAL: Excellent  CLINICAL DECISION MAKING: Stable/uncomplicated  EVALUATION COMPLEXITY: Low   GOALS:  SHORT TERM GOALS: Target date: 11/23/24  Ind with an initial HEP. Goal status: INITIAL   LONG TERM GOALS: Target date: 01/04/25  Ind with an advanced HEP.  Goal status: INITIAL  2.  Active left shoulder flexion to 165 degrees so the patient can easily reach overhead. Baseline:  Goal status: INITIAL  3.  Active ER to 90 degrees+ to allow for  easily donning/doffing of apparel.  Goal status: INITIAL  4.  Increase ROM so patient is able to reach behind back to L1.  Goal status: INITIAL  5.  Improve QUICKDASH score by at least 5 points.  Goal status: INITIAL  6.  Perform ADL's with pain not > 2-3/10.  Goal status:  INITIAL  PLAN:  PT FREQUENCY/DURATION:  8 visits.    PLANNED INTERVENTIONS: 97110-Therapeutic exercises, 97530- Therapeutic activity, [id number]- Neuromuscular re-education, 97535- Self Care, 02859- Manual therapy, G0283- Electrical stimulation (unattended), 97016- Vasopneumatic device, [id number]- Ultrasound, 79439 (1-2 muscles), 20561 (3+ muscles)- Dry Needling, Patient/Family education, Cryotherapy, and Moist heat  PLAN FOR NEXT SESSION: UBE, UE Ranger, PRE's, PROM.  Modalities as needed.     Abou Sterkel, PT 11/09/2024, 11:54 AM

## 2024-11-16 ENCOUNTER — Ambulatory Visit: Admitting: Physical Therapy

## 2024-11-16 DIAGNOSIS — M25612 Stiffness of left shoulder, not elsewhere classified: Secondary | ICD-10-CM | POA: Diagnosis not present

## 2024-11-16 DIAGNOSIS — M25512 Pain in left shoulder: Secondary | ICD-10-CM | POA: Diagnosis not present

## 2024-11-16 NOTE — Therapy (Signed)
 OUTPATIENT PHYSICAL THERAPY SHOULDER TREATMENT  Patient Name: Martin Parks MRN: [id number] DOB:[birth year], 49 y.o., male Today's Date: 11/16/2024  END OF SESSION:  PT End of Session - 11/16/24 1142     Visit Number 2    Number of Visits 8    Date for Recertification  01/04/25    PT Start Time 1015    PT Stop Time 1058    PT Time Calculation (min) 43 min    Activity Tolerance Patient tolerated treatment well    Behavior During Therapy [facility] for tasks assessed/performed           Past Medical History:  Diagnosis Date   Anxiety    Complication of anesthesia    hard time waking up after hernia surgery   Depression    Dysplastic nevus 04/19/2014   Right superior medial mid back. Mild, edges involved. Excised 05/10/2014, no residual DN.   Dysplastic nevus 03/31/2018   Medial scapular back. Moderate, edges free. Excised 06/09/2018, no residual DN.   GERD (gastroesophageal reflux disease)    h/o    Headache    migraines   History of kidney stones    currently   Hypertension    h/o years ago due to anxiety-taken off bp meds once anxiety meds started helping   Pneumonia    h/o    Umbilical hernia without obstruction and without gangrene 12/22/2018   Past Surgical History:  Procedure Laterality Date   COLONOSCOPY WITH PROPOFOL  N/A 03/30/2022   Procedure: COLONOSCOPY WITH PROPOFOL ;  Surgeon: Therisa Bi, MD;  Location: [facility] ENDOSCOPY;  Service: Gastroenterology;  Laterality: N/A;   HERNIA REPAIR     inguinal   TONSILLECTOMY     UMBILICAL HERNIA REPAIR N/A 03/02/2019   Procedure: OPEN HERNIA REPAIR UMBILICAL ADULT WITH MESH;  Surgeon: Nicholaus Selinda Birmingham, MD;  Location: [facility] ORS;  Service: General;  Laterality: N/A;   Patient Active Problem List   Diagnosis Date Noted   Contact dermatitis 05/25/2024   Rash and nonspecific skin eruption 05/25/2024   Viral URI with cough 05/25/2024   Class 2 severe obesity with serious comorbidity and body mass index (BMI) of 35.0 to 35.9 in  adult 02/09/2021   OSA on CPAP 06/05/2020   Prediabetes 02/10/2020   Hyperactivity 12/22/2018   Generalized anxiety disorder 12/22/2018   Hyperlipidemia LDL goal <100 12/22/2018   REFERRING PROVIDER: Jerel Madrid MD  REFERRING DIAG: Impingement of left shoulder, adhesive capsulitis.  THERAPY DIAG:  Acute pain of left shoulder  Stiffness of left shoulder, not elsewhere classified  Rationale for Evaluation and Treatment: Rehabilitation  ONSET DATE: ~2-3 months ago.  SUBJECTIVE:  SUBJECTIVE STATEMENT: No new complaints   PERTINENT HISTORY: See above.    PAIN:  Are you having pain? Yes: NPRS scale: 5-6/10. Pain location: Left shoulder. Pain description: Ache, throb, sharp.   Aggravating factors: Movement. Relieving factors: Rest.    PRECAUTIONS: None  RED FLAGS: None   WEIGHT BEARING RESTRICTIONS: No  FALLS:  Has patient fallen in last 6 months? No  LIVING ENVIRONMENT: Lives in: House/apartment Has following equipment at home: None  PLOF: Independent  PATIENT GOALS:Less pain and get back to weight lifting.    NEXT MD VISIT:   OBJECTIVE:   PATIENT SURVEYS:  QUICKDASH:  32 points (47.7%).    POSTURE: Rounded shoulders.  UPPER EXTREMITY ROM:   Left shoulder active antigravity flexion to 155 degrees (right is 180 degrees), ER is 80 degrees and behind back to L4.    UPPER EXTREMITY MMT:  Normal left shoulder strength.    SHOULDER SPECIAL TESTS: Some pain reproduction with a left shoulder Impingement test.  Normal UE DTR's.  PALPATION:  Palpable pain in left acromial ridge region.                                                                                                                               TREATMENT DATE:   11/16/24:                                        EXERCISE LOG  Exercise Repetitions and Resistance Comments  UBE 10 minutes   Pulleys 5 minutes   UE ranger on wall 4 minutes   RW4 Red theraband to fatigue all motion.   [facility] ER 3# to fatigue x 2.   In supine:  PROM/stretching to left shoulder x 11 minutes.     PATIENT EDUCATION: Education details: [id number] Person educated: Patient Education method: handout Education comprehension: Excellent.  HOME EXERCISE PROGRAM: As above.    ASSESSMENT:  CLINICAL IMPRESSION: Patient is highly motivated and performed all exercises with excellent technique and no complaints.    OBJECTIVE IMPAIRMENTS: decreased activity tolerance, decreased ROM, increased muscle spasms, and pain.   ACTIVITY LIMITATIONS: carrying, lifting, reach over head, and reach behind back.  PARTICIPATION LIMITATIONS: cleaning  PERSONAL FACTORS: Time since onset of injury/illness/exacerbation are also affecting patient's functional outcome.   REHAB POTENTIAL: Excellent  CLINICAL DECISION MAKING: Stable/uncomplicated  EVALUATION COMPLEXITY: Low   GOALS:  SHORT TERM GOALS: Target date: 11/23/24  Ind with an initial HEP. Goal status: INITIAL   LONG TERM GOALS: Target date: 01/04/25  Ind with an advanced HEP.  Goal status: INITIAL  2.  Active left shoulder flexion to 165 degrees so the patient can easily reach overhead. Baseline:  Goal status: INITIAL  3.  Active ER to 90 degrees+ to allow for easily donning/doffing of apparel.  Goal status: INITIAL  4.  Increase ROM so patient is  able to reach behind back to L1.  Goal status: INITIAL  5.  Improve QUICKDASH score by at least 5 points.  Goal status: INITIAL  6.  Perform ADL's with pain not > 2-3/10.  Goal status: INITIAL  PLAN:  PT FREQUENCY/DURATION:  8 visits.    PLANNED INTERVENTIONS: 97110-Therapeutic exercises, 97530- Therapeutic activity, [id number]- Neuromuscular re-education, 97535- Self Care, 02859- Manual therapy, G0283- Electrical stimulation  (unattended), 97016- Vasopneumatic device, [id number]- Ultrasound, 79439 (1-2 muscles), 20561 (3+ muscles)- Dry Needling, Patient/Family education, Cryotherapy, and Moist heat  PLAN FOR NEXT SESSION: UBE, UE Ranger, PRE's, PROM.  Modalities as needed.     Ethal Gotay, PT 11/16/2024, 11:50 AM

## 2024-11-18 ENCOUNTER — Ambulatory Visit (INDEPENDENT_AMBULATORY_CARE_PROVIDER_SITE_OTHER): Admitting: Behavioral Health

## 2024-11-18 ENCOUNTER — Encounter: Payer: Self-pay | Admitting: Behavioral Health

## 2024-11-18 DIAGNOSIS — F411 Generalized anxiety disorder: Secondary | ICD-10-CM | POA: Diagnosis not present

## 2024-11-18 DIAGNOSIS — F331 Major depressive disorder, recurrent, moderate: Secondary | ICD-10-CM

## 2024-11-18 MED ORDER — DULOXETINE HCL 20 MG PO CPEP: 20.0000 mg | ORAL_CAPSULE | Freq: Two times a day (BID) | ORAL | 1 refills | Status: DC

## 2024-11-18 NOTE — Progress Notes (Signed)
 [facility]  Patient ID: Martin Parks,  MRN: [id number]  PCP: Leavy Mole, PA-C  Date of Evaluation: 11/18/2024 Time spent:30 minutes  Chief Complaint:  Chief Complaint   Depression; Anxiety; Follow-up; Medication Refill; Patient Education; Stress     HISTORY/CURRENT STATUS: HPI Martin Parks, 49 year old male presents to this for follow-up and medication management.  He is concerned because he has been experiencing increased body aches and joint pain since he started Lexapro  and L-methyl folate.  He stopped L-methylfolate and reports headaches mildly improved.  States that he would like to try a different AD at this time .  Continues to use his xanax  responsibly.  Numerically rates depression today at 3/10, and anxiety at 5/10.  He denies any current mania, no psychosis, no auditory or visual hallucinations or delirium.  Works full-time 40+ hours per week at [employer] in [place].  Denies SI or HI.   Past psychiatric medication trials: Prozac Pristiq Xanax          Individual Medical History/ Review of Systems: Changes? :No   Allergies: Grass extracts [gramineae pollens] and Adhesive [tape]  Current Medications:  Current Outpatient Medications:    DULoxetine  (CYMBALTA ) 20 MG capsule, Take 1 capsule (20 mg total) by mouth 2 (two) times daily., Disp: 60 capsule, Rfl: 1   albuterol  (VENTOLIN  HFA) 108 (90 Base) MCG/ACT inhaler, Inhale 2 puffs into the lungs every 6 (six) hours as needed for wheezing or shortness of breath. (Patient not taking: Reported on 11/09/2024), Disp: 8 g, Rfl: 2   ALPRAZolam  (XANAX ) 0.5 MG tablet, Take 1 tablet (0.5 mg total) by mouth daily as needed for anxiety., Disp: 30 tablet, Rfl: 2   b complex vitamins capsule, Take 1 capsule by mouth daily., Disp: , Rfl:    benzonatate  (TESSALON ) 100 MG capsule, Take 1 capsule (100 mg total) by mouth 2 (two) times daily as needed for cough., Disp: 20 capsule, Rfl: 0   cholecalciferol (VITAMIN  D3) 25 MCG (1000 UNIT) tablet, Take 5,000 Units by mouth daily., Disp: , Rfl:    CLOMID  50 MG tablet, TAKE 1/2 TABLET(25 MG) BY MOUTH DAILY, Disp: 30 tablet, Rfl: 3   escitalopram  (LEXAPRO ) 10 MG tablet, Take 1 tablet (10 mg total) by mouth daily., Disp: 30 tablet, Rfl: 2   hydrocortisone  2.5 % lotion, Apply topically 3 (three) times a week. Apply to rash on chin 3 nights weekly, Tuesday, Thursday and Saturday (Patient not taking: Reported on 11/09/2024), Disp: 59 mL, Rfl: 6   ketoconazole  (NIZORAL ) 2 % cream, Apply to the feet QHS and apply to the face QD on Monday, Wednesday, and Friday. (Patient not taking: Reported on 11/09/2024), Disp: 60 g, Rfl: 11   methylPREDNISolone  (MEDROL  DOSEPAK) 4 MG TBPK tablet, Use as directed., Disp: 21 each, Rfl: 0   mometasone  (ELOCON ) 0.1 % cream, Apply 1 Application topically daily as needed (Rash). (Patient not taking: Reported on 11/09/2024), Disp: 45 g, Rfl: 1   Omega-3 Fatty Acids (FISH OIL) 1000 MG CAPS, Take 1,000 mg by mouth daily. , Disp: , Rfl:    Turmeric 500 MG CAPS, Take 500 mg by mouth daily. , Disp: , Rfl:  Medication Side Effects: none  Family Medical/ Social History: Changes? No  MENTAL HEALTH EXAM:  There were no vitals taken for this visit.There is no height or weight on file to calculate BMI.  General Appearance: Casual, Neat, and Well Groomed  Eye Contact:  Good  Speech:  Clear and Coherent  Volume:  Normal  Mood:  Anxious and Depressed  Affect:  Congruent, Depressed, and Anxious  Thought Process:  Coherent  Orientation:  Full (Time, Place, and Person)  Thought Content: Logical   Suicidal Thoughts:  No  Homicidal Thoughts:  No  Memory:  WNL  Judgement:  Good  Insight:  Good  Psychomotor Activity:  Normal  Concentration:  Concentration: Good  Recall:  Good  Fund of Knowledge: Good  Language: Good  Assets:  Desire for Improvement  ADL's:  Intact  Cognition: WNL  Prognosis:  Good    DIAGNOSES:    ICD-10-CM   1.  Generalized anxiety disorder  F41.1 DULoxetine (CYMBALTA) 20 MG capsule    2. Major depressive disorder, recurrent episode, moderate (HCC)  F33.1 DULoxetine (CYMBALTA) 20 MG capsule      Receiving Psychotherapy: No    RECOMMENDATIONS:   Greater than 50% of  30 min face to face time with patient was spent on counseling and coordination of care.  We discussed his desire to switch from Lexapro  to a different AD. He was experiencing body and joint pain along with increased headaches. Also believes that L-methyl folate supplement was also contributing but not sure.  We discussed his desire to start trial of duloxetine. We agreed to: To reduce Lexapro  10 mg daily in the a.m. to 5 mg daily for 1 week, then stop. Will start duloxetine 20 mg at bedtime daily for 1 week, then add 20 mg in the morning after breakfast. To continue Xanax  0.5 mg daily as needed for more severe anxiety Will report worsening symptoms or side effects promptly To follow-up in 4 weeks to reassess Provided emergency contact information Discussed potential benefits, risk, and side effects of benzodiazepines to include potential risk of tolerance and dependence, as well as possible drowsiness.  Advised patient not to drive if experiencing drowsiness and to take lowest possible effective dose to minimize risk of dependence and tolerance.  Reviewed PDMP     Redell DELENA Pizza, NP

## 2024-11-20 ENCOUNTER — Other Ambulatory Visit: Payer: Self-pay

## 2024-11-20 DIAGNOSIS — F411 Generalized anxiety disorder: Secondary | ICD-10-CM

## 2024-11-20 DIAGNOSIS — F331 Major depressive disorder, recurrent, moderate: Secondary | ICD-10-CM

## 2024-11-20 MED ORDER — DULOXETINE HCL 20 MG PO CPEP: 20.0000 mg | ORAL_CAPSULE | Freq: Two times a day (BID) | ORAL | 1 refills | Status: DC

## 2024-11-23 ENCOUNTER — Ambulatory Visit: Admitting: *Deleted

## 2024-11-23 ENCOUNTER — Encounter: Payer: Self-pay | Admitting: *Deleted

## 2024-11-23 DIAGNOSIS — M25512 Pain in left shoulder: Secondary | ICD-10-CM

## 2024-11-23 DIAGNOSIS — M25612 Stiffness of left shoulder, not elsewhere classified: Secondary | ICD-10-CM

## 2024-11-23 NOTE — Therapy (Signed)
 OUTPATIENT PHYSICAL THERAPY SHOULDER TREATMENT  Patient Name: Martin Parks MRN: [id number] DOB:03-Oct-1975, 49 y.o., male Today's Date: 11/23/2024  END OF SESSION:  PT End of Session - 11/23/24 1100     Visit Number 3    Number of Visits 8    Date for Recertification  01/04/25    PT Start Time 1100           Past Medical History:  Diagnosis Date   Anxiety    Complication of anesthesia    hard time waking up after hernia surgery   Depression    Dysplastic nevus 04/19/2014   Right superior medial mid back. Mild, edges involved. Excised 05/10/2014, no residual DN.   Dysplastic nevus 03/31/2018   Medial scapular back. Moderate, edges free. Excised 06/09/2018, no residual DN.   GERD (gastroesophageal reflux disease)    h/o    Headache    migraines   History of kidney stones    currently   Hypertension    h/o years ago due to anxiety-taken off bp meds once anxiety meds started helping   Pneumonia    h/o    Umbilical hernia without obstruction and without gangrene 12/22/2018   Past Surgical History:  Procedure Laterality Date   COLONOSCOPY WITH PROPOFOL  N/A 03/30/2022   Procedure: COLONOSCOPY WITH PROPOFOL ;  Surgeon: Therisa Bi, MD;  Location: [facility] ENDOSCOPY;  Service: Gastroenterology;  Laterality: N/A;   HERNIA REPAIR     inguinal   TONSILLECTOMY     UMBILICAL HERNIA REPAIR N/A 03/02/2019   Procedure: OPEN HERNIA REPAIR UMBILICAL ADULT WITH MESH;  Surgeon: Nicholaus Selinda Birmingham, MD;  Location: [facility] ORS;  Service: General;  Laterality: N/A;   Patient Active Problem List   Diagnosis Date Noted   Contact dermatitis 05/25/2024   Rash and nonspecific skin eruption 05/25/2024   Viral URI with cough 05/25/2024   Class 2 severe obesity with serious comorbidity and body mass index (BMI) of 35.0 to 35.9 in adult 02/09/2021   OSA on CPAP 06/05/2020   Prediabetes 02/10/2020   Hyperactivity 12/22/2018   Generalized anxiety disorder 12/22/2018   Hyperlipidemia LDL goal <100  12/22/2018   REFERRING PROVIDER: Jerel Madrid MD  REFERRING DIAG: Impingement of left shoulder, adhesive capsulitis.  THERAPY DIAG:  Acute pain of left shoulder  Stiffness of left shoulder, not elsewhere classified  Rationale for Evaluation and Treatment: Rehabilitation  ONSET DATE: ~2-3 months ago.  SUBJECTIVE:                                                                                                                                                                                      SUBJECTIVE STATEMENT: No new  complaints   PERTINENT HISTORY: See above.    PAIN:  Are you having pain? Yes: NPRS scale: 5-6/10. Pain location: Left shoulder. Pain description: Ache, throb, sharp.   Aggravating factors: Movement. Relieving factors: Rest.    PRECAUTIONS: None  RED FLAGS: None   WEIGHT BEARING RESTRICTIONS: No  FALLS:  Has patient fallen in last 6 months? No  LIVING ENVIRONMENT: Lives in: House/apartment Has following equipment at home: None  PLOF: Independent  PATIENT GOALS:Less pain and get back to weight lifting.    NEXT MD VISIT:   OBJECTIVE:   PATIENT SURVEYS:  QUICKDASH:  32 points (47.7%).    POSTURE: Rounded shoulders.  UPPER EXTREMITY ROM:   Left shoulder active antigravity flexion to 155 degrees (right is 180 degrees), ER is 80 degrees and behind back to L4.    UPPER EXTREMITY MMT:  Normal left shoulder strength.    SHOULDER SPECIAL TESTS: Some pain reproduction with a left shoulder Impingement test.  Normal UE DTR's.  PALPATION:  Palpable pain in left acromial ridge region.                                                                                                                               TREATMENT DATE:   11/23/24:                                       EXERCISE LOG       LT shldr  Exercise Repetitions and Resistance Comments  UBE 10 minutes  60 RPMs   Pulleys 5 minutes   UE ranger on wall 5  minutes   IR/ER Green   theraband  to fatigue each way   SDLY ER      Ts and Ys Green 2x 15 each   In supine:  PROM/stretching to left shoulder  ER and IR with end-range holds   PATIENT EDUCATION: Education details: [id number] Person educated: Patient Education method: handout Education comprehension: Excellent.  HOME EXERCISE PROGRAM: As above.    ASSESSMENT:  CLINICAL IMPRESSION: Patient  arrived today reporting doing okay after last Rx. Rx focused on ROM progressions as well as light strengthening with Tband and did well.  OBJECTIVE IMPAIRMENTS: decreased activity tolerance, decreased ROM, increased muscle spasms, and pain.   ACTIVITY LIMITATIONS: carrying, lifting, reach over head, and reach behind back.  PARTICIPATION LIMITATIONS: cleaning  PERSONAL FACTORS: Time since onset of injury/illness/exacerbation are also affecting patient's functional outcome.   REHAB POTENTIAL: Excellent  CLINICAL DECISION MAKING: Stable/uncomplicated  EVALUATION COMPLEXITY: Low   GOALS:  SHORT TERM GOALS: Target date: 11/23/24  Ind with an initial HEP. Goal status: INITIAL   LONG TERM GOALS: Target date: 01/04/25  Ind with an advanced HEP.  Goal status: INITIAL  2.  Active left shoulder flexion to 165 degrees so the patient can easily reach overhead. Baseline:  Goal status:  INITIAL  3.  Active ER to 90 degrees+ to allow for easily donning/doffing of apparel.  Goal status: INITIAL  4.  Increase ROM so patient is able to reach behind back to L1.  Goal status: INITIAL  5.  Improve QUICKDASH score by at least 5 points.  Goal status: INITIAL  6.  Perform ADL's with pain not > 2-3/10.  Goal status: INITIAL  PLAN:  PT FREQUENCY/DURATION:  8 visits.    PLANNED INTERVENTIONS: 97110-Therapeutic exercises, 97530- Therapeutic activity, [id number]- Neuromuscular re-education, 97535- Self Care, 02859- Manual therapy, G0283- Electrical stimulation (unattended), 97016- Vasopneumatic device, [id number]- Ultrasound, 79439  (1-2 muscles), 20561 (3+ muscles)- Dry Needling, Patient/Family education, Cryotherapy, and Moist heat  PLAN FOR NEXT SESSION: UBE, UE Ranger, PRE's, PROM.  Modalities as needed.     Kazoua Gossen,CHRIS, PTA 11/23/2024, 11:03 AM

## 2024-11-30 ENCOUNTER — Ambulatory Visit: Admitting: Physical Therapy

## 2024-11-30 DIAGNOSIS — M25512 Pain in left shoulder: Secondary | ICD-10-CM | POA: Insufficient documentation

## 2024-11-30 DIAGNOSIS — M25612 Stiffness of left shoulder, not elsewhere classified: Secondary | ICD-10-CM | POA: Diagnosis present

## 2024-11-30 NOTE — Therapy (Signed)
 OUTPATIENT PHYSICAL THERAPY SHOULDER TREATMENT  Patient Name: Martin Parks MRN: [id number] DOB:27-May-1975, 49 y.o., male Today's Date: 11/30/2024  END OF SESSION:  PT End of Session - 11/30/24 1308     Visit Number 4    Number of Visits 8    Date for Recertification  01/04/25    PT Start Time 0100    PT Stop Time 0143    PT Time Calculation (min) 43 min    Activity Tolerance Patient tolerated treatment well    Behavior During Therapy [facility] for tasks assessed/performed           Past Medical History:  Diagnosis Date   Anxiety    Complication of anesthesia    hard time waking up after hernia surgery   Depression    Dysplastic nevus 04/19/2014   Right superior medial mid back. Mild, edges involved. Excised 05/10/2014, no residual DN.   Dysplastic nevus 03/31/2018   Medial scapular back. Moderate, edges free. Excised 06/09/2018, no residual DN.   GERD (gastroesophageal reflux disease)    h/o    Headache    migraines   History of kidney stones    currently   Hypertension    h/o years ago due to anxiety-taken off bp meds once anxiety meds started helping   Pneumonia    h/o    Umbilical hernia without obstruction and without gangrene 12/22/2018   Past Surgical History:  Procedure Laterality Date   COLONOSCOPY WITH PROPOFOL  N/A 03/30/2022   Procedure: COLONOSCOPY WITH PROPOFOL ;  Surgeon: Therisa Bi, MD;  Location: [facility] ENDOSCOPY;  Service: Gastroenterology;  Laterality: N/A;   HERNIA REPAIR     inguinal   TONSILLECTOMY     UMBILICAL HERNIA REPAIR N/A 03/02/2019   Procedure: OPEN HERNIA REPAIR UMBILICAL ADULT WITH MESH;  Surgeon: Nicholaus Selinda Birmingham, MD;  Location: [facility] ORS;  Service: General;  Laterality: N/A;   Patient Active Problem List   Diagnosis Date Noted   Contact dermatitis 05/25/2024   Rash and nonspecific skin eruption 05/25/2024   Viral URI with cough 05/25/2024   Class 2 severe obesity with serious comorbidity and body mass index (BMI) of 35.0 to 35.9 in  adult 02/09/2021   OSA on CPAP 06/05/2020   Prediabetes 02/10/2020   Hyperactivity 12/22/2018   Generalized anxiety disorder 12/22/2018   Hyperlipidemia LDL goal <100 12/22/2018   REFERRING PROVIDER: Jerel Madrid MD  REFERRING DIAG: Impingement of left shoulder, adhesive capsulitis.  THERAPY DIAG:  Acute pain of left shoulder  Stiffness of left shoulder, not elsewhere classified  Rationale for Evaluation and Treatment: Rehabilitation  ONSET DATE: ~2-3 months ago.  SUBJECTIVE:  SUBJECTIVE STATEMENT: Sore.  PERTINENT HISTORY: See above.    PAIN:  Are you having pain? Yes: NPRS scale: 5-6/10. Pain location: Left shoulder. Pain description: Ache, throb, sharp.   Aggravating factors: Movement. Relieving factors: Rest.    PRECAUTIONS: None  RED FLAGS: None   WEIGHT BEARING RESTRICTIONS: No  FALLS:  Has patient fallen in last 6 months? No  LIVING ENVIRONMENT: Lives in: House/apartment Has following equipment at home: None  PLOF: Independent  PATIENT GOALS:Less pain and get back to weight lifting.    NEXT MD VISIT:   OBJECTIVE:   PATIENT SURVEYS:  QUICKDASH:  32 points (47.7%).    POSTURE: Rounded shoulders.  UPPER EXTREMITY ROM:   Left shoulder active antigravity flexion to 155 degrees (right is 180 degrees), ER is 80 degrees and behind back to L4.    UPPER EXTREMITY MMT:  Normal left shoulder strength.    SHOULDER SPECIAL TESTS: Some pain reproduction with a left shoulder Impingement test.  Normal UE DTR's.  PALPATION:  Palpable pain in left acromial ridge region.                                                                                                                               TREATMENT DATE:   11/30/24:                                     EXERCISE  LOG  Exercise Repetitions and Resistance Comments  UBE @90  RPM's x 10 minutes   Pulleys 5 minutes   UE Ranger on wall 5 minutes   RW4 Green theraband to fatigue.       STW/M x 14 minutes to patient's left shoulder posterior cuff musculature, middle and anterior deltoid.     11/23/24:                                       EXERCISE LOG       LT shldr  Exercise Repetitions and Resistance Comments  UBE 10 minutes  60 RPMs   Pulleys 5 minutes   UE ranger on wall 5  minutes   IR/ER Green  theraband  to fatigue each way   SDLY ER      Ts and Ys Green 2x 15 each   In supine:  PROM/stretching to left shoulder  ER and IR with end-range holds   PATIENT EDUCATION: Education details: RW67 Person educated: Patient Education method: handout Education comprehension: Excellent.  HOME EXERCISE PROGRAM: As above.    ASSESSMENT:  CLINICAL IMPRESSION: Patient sore but doing well.  He performs his therex with excellent technique.  He did well with soft tissue work and had notable tone in his left posterior cuff musculature.    OBJECTIVE IMPAIRMENTS: decreased activity tolerance, decreased ROM, increased muscle  spasms, and pain.   ACTIVITY LIMITATIONS: carrying, lifting, reach over head, and reach behind back.  PARTICIPATION LIMITATIONS: cleaning  PERSONAL FACTORS: Time since onset of injury/illness/exacerbation are also affecting patient's functional outcome.   REHAB POTENTIAL: Excellent  CLINICAL DECISION MAKING: Stable/uncomplicated  EVALUATION COMPLEXITY: Low   GOALS:  SHORT TERM GOALS: Target date: 11/23/24  Ind with an initial HEP. Goal status: INITIAL   LONG TERM GOALS: Target date: 01/04/25  Ind with an advanced HEP.  Goal status: INITIAL  2.  Active left shoulder flexion to 165 degrees so the patient can easily reach overhead. Baseline:  Goal status: INITIAL  3.  Active ER to 90 degrees+ to allow for easily donning/doffing of apparel.  Goal status:  INITIAL  4.  Increase ROM so patient is able to reach behind back to L1.  Goal status: INITIAL  5.  Improve QUICKDASH score by at least 5 points.  Goal status: INITIAL  6.  Perform ADL's with pain not > 2-3/10.  Goal status: INITIAL  PLAN:  PT FREQUENCY/DURATION:  8 visits.    PLANNED INTERVENTIONS: 97110-Therapeutic exercises, 97530- Therapeutic activity, [id number]- Neuromuscular re-education, 97535- Self Care, 02859- Manual therapy, G0283- Electrical stimulation (unattended), 97016- Vasopneumatic device, [id number]- Ultrasound, 79439 (1-2 muscles), 20561 (3+ muscles)- Dry Needling, Patient/Family education, Cryotherapy, and Moist heat  PLAN FOR NEXT SESSION: UBE, UE Ranger, PRE's, PROM.  Modalities as needed.     Alesha Jaffee, PT 11/30/2024, 2:24 PM

## 2024-12-08 ENCOUNTER — Ambulatory Visit: Admitting: *Deleted

## 2024-12-15 ENCOUNTER — Encounter: Payer: Self-pay | Admitting: Physical Therapy

## 2024-12-15 ENCOUNTER — Ambulatory Visit: Admitting: Physical Therapy

## 2024-12-15 DIAGNOSIS — M25612 Stiffness of left shoulder, not elsewhere classified: Secondary | ICD-10-CM

## 2024-12-15 DIAGNOSIS — M25512 Pain in left shoulder: Secondary | ICD-10-CM

## 2024-12-15 NOTE — Therapy (Signed)
 OUTPATIENT PHYSICAL THERAPY SHOULDER TREATMENT  Patient Name: Martin Parks MRN: [id number] DOB:[birth year], 49 y.o., male Today's Date: 12/15/2024  END OF SESSION:  PT End of Session - 12/15/24 1401     Visit Number 5    Number of Visits 8    Date for Recertification  01/04/25    PT Start Time 0145    PT Stop Time 0229    PT Time Calculation (min) 44 min    Activity Tolerance Patient tolerated treatment well    Behavior During Therapy [facility] for tasks assessed/performed           Past Medical History:  Diagnosis Date   Anxiety    Complication of anesthesia    hard time waking up after hernia surgery   Depression    Dysplastic nevus 04/19/2014   Right superior medial mid back. Mild, edges involved. Excised 05/10/2014, no residual DN.   Dysplastic nevus 03/31/2018   Medial scapular back. Moderate, edges free. Excised 06/09/2018, no residual DN.   GERD (gastroesophageal reflux disease)    h/o    Headache    migraines   History of kidney stones    currently   Hypertension    h/o years ago due to anxiety-taken off bp meds once anxiety meds started helping   Pneumonia    h/o    Umbilical hernia without obstruction and without gangrene 12/22/2018   Past Surgical History:  Procedure Laterality Date   COLONOSCOPY WITH PROPOFOL  N/A 03/30/2022   Procedure: COLONOSCOPY WITH PROPOFOL ;  Surgeon: Therisa Bi, MD;  Location: [facility] ENDOSCOPY;  Service: Gastroenterology;  Laterality: N/A;   HERNIA REPAIR     inguinal   TONSILLECTOMY     UMBILICAL HERNIA REPAIR N/A 03/02/2019   Procedure: OPEN HERNIA REPAIR UMBILICAL ADULT WITH MESH;  Surgeon: Nicholaus Selinda Birmingham, MD;  Location: [facility] ORS;  Service: General;  Laterality: N/A;   Patient Active Problem List   Diagnosis Date Noted   Contact dermatitis 05/25/2024   Rash and nonspecific skin eruption 05/25/2024   Viral URI with cough 05/25/2024   Class 2 severe obesity with serious comorbidity and body mass index (BMI) of 35.0 to 35.9 in  adult 02/09/2021   OSA on CPAP 06/05/2020   Prediabetes 02/10/2020   Hyperactivity 12/22/2018   Generalized anxiety disorder 12/22/2018   Hyperlipidemia LDL goal <100 12/22/2018   REFERRING PROVIDER: Jerel Madrid MD  REFERRING DIAG: Impingement of left shoulder, adhesive capsulitis.  THERAPY DIAG:  Acute pain of left shoulder  Stiffness of left shoulder, not elsewhere classified  Rationale for Evaluation and Treatment: Rehabilitation  ONSET DATE: ~2-3 months ago.  SUBJECTIVE:  SUBJECTIVE STATEMENT: Had a flare-up.  Got another injection.  Pain aroud a 4-5/10 todAY.    PERTINENT HISTORY: See above.    PAIN:  Are you having pain? Yes: NPRS scale: 5-6/10. Pain location: Left shoulder. Pain description: Ache, throb, sharp.   Aggravating factors: Movement. Relieving factors: Rest.    PRECAUTIONS: None  RED FLAGS: None   WEIGHT BEARING RESTRICTIONS: No  FALLS:  Has patient fallen in last 6 months? No  LIVING ENVIRONMENT: Lives in: House/apartment Has following equipment at home: None  PLOF: Independent  PATIENT GOALS:Less pain and get back to weight lifting.    NEXT MD VISIT:   OBJECTIVE:   PATIENT SURVEYS:  QUICKDASH:  32 points (47.7%).    POSTURE: Rounded shoulders.  UPPER EXTREMITY ROM:   Left shoulder active antigravity flexion to 155 degrees (right is 180 degrees), ER is 80 degrees and behind back to L4.    UPPER EXTREMITY MMT:  Normal left shoulder strength.    SHOULDER SPECIAL TESTS: Some pain reproduction with a left shoulder Impingement test.  Normal UE DTR's.  PALPATION:  Palpable pain in left acromial ridge region.                                                                                                                               TREATMENT DATE:    12?15/25:                                     EXERCISE LOG  Exercise Repetitions and Resistance Comments  UBE 10 minutes   Pulleys 5 minutes   UE Ranger on wall 5 minutes   Shoulder abd (< 90 degrees) 3#, 2 sets of 15.   Full can 3#, 2 sets of 15.   Shoulder flex 3#, 2 sets of 15.   Biceps curls 7# 2 sets of 15   In supine:  1-1 stretching to patient's left shoulder x 10 minutes.    11/30/24:                                     EXERCISE LOG  Exercise Repetitions and Resistance Comments  UBE @90  RPM's x 10 minutes   Pulleys 5 minutes   UE Ranger on wall 5 minutes   RW4 Green theraband to fatigue.       STW/M x 14 minutes to patient's left shoulder posterior cuff musculature, middle and anterior deltoid.     11/23/24:                                       EXERCISE LOG       LT shldr  Exercise Repetitions and Resistance Comments  UBE 10 minutes  60 RPMs   Pulleys 5 minutes   UE ranger on wall 5  minutes   IR/ER Green  theraband  to fatigue each way   SDLY ER      Ts and Ys Green 2x 15 each   In supine:  PROM/stretching to left shoulder  ER and IR with end-range holds   PATIENT EDUCATION: Education details: RW58 Person educated: Patient Education method: handout Education comprehension: Excellent.  HOME EXERCISE PROGRAM: As above.    ASSESSMENT:  CLINICAL IMPRESSION: Patient remains very motivated.  Added new strengthening interventions which patient performed with excellent technique.    OBJECTIVE IMPAIRMENTS: decreased activity tolerance, decreased ROM, increased muscle spasms, and pain.   ACTIVITY LIMITATIONS: carrying, lifting, reach over head, and reach behind back.  PARTICIPATION LIMITATIONS: cleaning  PERSONAL FACTORS: Time since onset of injury/illness/exacerbation are also affecting patient's functional outcome.   REHAB POTENTIAL: Excellent  CLINICAL DECISION MAKING: Stable/uncomplicated  EVALUATION COMPLEXITY: Low   GOALS:  SHORT  TERM GOALS: Target date: 11/23/24  Ind with an initial HEP. Goal status: INITIAL   LONG TERM GOALS: Target date: 01/04/25  Ind with an advanced HEP.  Goal status: INITIAL  2.  Active left shoulder flexion to 165 degrees so the patient can easily reach overhead. Baseline:  Goal status: INITIAL  3.  Active ER to 90 degrees+ to allow for easily donning/doffing of apparel.  Goal status: INITIAL  4.  Increase ROM so patient is able to reach behind back to L1.  Goal status: INITIAL  5.  Improve QUICKDASH score by at least 5 points.  Goal status: INITIAL  6.  Perform ADL's with pain not > 2-3/10.  Goal status: INITIAL  PLAN:  PT FREQUENCY/DURATION:  8 visits.    PLANNED INTERVENTIONS: 97110-Therapeutic exercises, 97530- Therapeutic activity, [id number]- Neuromuscular re-education, 97535- Self Care, 02859- Manual therapy, G0283- Electrical stimulation (unattended), 97016- Vasopneumatic device, [id number]- Ultrasound, 79439 (1-2 muscles), 20561 (3+ muscles)- Dry Needling, Patient/Family education, Cryotherapy, and Moist heat  PLAN FOR NEXT SESSION: UBE, UE Ranger, PRE's, PROM.  Modalities as needed.     Rashawnda Gaba, PT 12/15/2024, 3:07 PM

## 2024-12-17 ENCOUNTER — Ambulatory Visit: Admitting: Behavioral Health

## 2024-12-17 ENCOUNTER — Encounter: Payer: Self-pay | Admitting: Behavioral Health

## 2024-12-17 DIAGNOSIS — F411 Generalized anxiety disorder: Secondary | ICD-10-CM

## 2024-12-17 DIAGNOSIS — F331 Major depressive disorder, recurrent, moderate: Secondary | ICD-10-CM

## 2024-12-17 MED ORDER — DULOXETINE HCL 20 MG PO CPEP: 20.0000 mg | ORAL_CAPSULE | Freq: Two times a day (BID) | ORAL | 1 refills | Status: AC

## 2024-12-17 NOTE — Progress Notes (Signed)
 [facility]  Patient ID: Martin Parks,  MRN: [id number]  PCP: Leavy Mole, PA-C (Inactive)  Date of Evaluation: 12/17/2024 Time spent:20 minutes  Chief Complaint:  Chief Complaint   Depression; Anxiety; Follow-up; Medication Refill; Patient Education; Stress     HISTORY/CURRENT STATUS: HPI  Martin Parks, 49 year old male presents to this for follow-up and medication management.  Patient states that he is experiencing overall good stability.  He does not feel the need to adjust or change any of his medications this visit.  He is requesting 31-month follow-up.  Continues to use his xanax  responsibly.  Numerically rates depression today at 3/10, and anxiety at 4/10.  He denies any current mania, no psychosis, no auditory or visual hallucinations or delirium.  Works full-time 40+ hours per week at [employer] in [place].  Denies SI or HI.   Past psychiatric medication trials: Prozac Pristiq Xanax    Individual Medical History/ Review of Systems: Changes? :No   Allergies: Grass extracts [gramineae pollens] and Adhesive [tape]  Current Medications: Current Medications[1] Medication Side Effects: none  Family Medical/ Social History: Changes? No  MENTAL HEALTH EXAM:  There were no vitals taken for this visit.There is no height or weight on file to calculate BMI.  General Appearance: Casual, Neat, and Well Groomed  Eye Contact:  Good  Speech:  Clear and Coherent  Volume:  Normal  Mood:  NA  Affect:  Appropriate  Thought Process:  Coherent  Orientation:  Full (Time, Place, and Person)  Thought Content: Logical   Suicidal Thoughts:  No  Homicidal Thoughts:  No  Memory:  WNL  Judgement:  Good  Insight:  Good  Psychomotor Activity:  Normal  Concentration:  Concentration: Good  Recall:  Good  Fund of Knowledge: Good  Language: Good  Assets:  Desire for Improvement  ADL's:  Intact  Cognition: WNL  Prognosis:  Good    DIAGNOSES:    ICD-10-CM   1.  Generalized anxiety disorder  F41.1 DULoxetine  (CYMBALTA ) 20 MG capsule    2. Major depressive disorder, recurrent episode, moderate (HCC)  F33.1 DULoxetine  (CYMBALTA ) 20 MG capsule      Receiving Psychotherapy: No    RECOMMENDATIONS:   Greater than 50% of  30 min face to face time with patient was spent on counseling and coordination of care.  We discussed patient's report of good stability right now.  He does have some periodic internal tension.  Thoughts are linear.  Agrees to continue with current medication regimen and is requesting a 84-month follow-up.  Says that he will call with any increase of negative symptoms.   We agreed to: To continue duloxetine  20 mg twice daily  To continue Xanax  0.5 mg daily as needed for more severe anxiety Will report worsening symptoms or side effects promptly To follow-up in 6 months to reassess Provided emergency contact information Discussed potential benefits, risk, and side effects of benzodiazepines to include potential risk of tolerance and dependence, as well as possible drowsiness.  Advised patient not to drive if experiencing drowsiness and to take lowest possible effective dose to minimize risk of dependence and tolerance.  Reviewed PDMP     Redell DELENA Pizza, NP     [1]  Current Outpatient Medications:    albuterol  (VENTOLIN  HFA) 108 (90 Base) MCG/ACT inhaler, Inhale 2 puffs into the lungs every 6 (six) hours as needed for wheezing or shortness of breath. (Patient not taking: Reported on 11/09/2024), Disp: 8 g, Rfl: 2   ALPRAZolam  (XANAX ) 0.5 MG tablet,  Take 1 tablet (0.5 mg total) by mouth daily as needed for anxiety., Disp: 30 tablet, Rfl: 2   b complex vitamins capsule, Take 1 capsule by mouth daily., Disp: , Rfl:    benzonatate  (TESSALON ) 100 MG capsule, Take 1 capsule (100 mg total) by mouth 2 (two) times daily as needed for cough., Disp: 20 capsule, Rfl: 0   cholecalciferol (VITAMIN D3) 25 MCG (1000 UNIT) tablet, Take 5,000 Units by  mouth daily., Disp: , Rfl:    CLOMID  50 MG tablet, TAKE 1/2 TABLET(25 MG) BY MOUTH DAILY, Disp: 30 tablet, Rfl: 3   DULoxetine  (CYMBALTA ) 20 MG capsule, Take 1 capsule (20 mg total) by mouth 2 (two) times daily., Disp: 180 capsule, Rfl: 1   hydrocortisone  2.5 % lotion, Apply topically 3 (three) times a week. Apply to rash on chin 3 nights weekly, Tuesday, Thursday and Saturday (Patient not taking: Reported on 11/09/2024), Disp: 59 mL, Rfl: 6   ketoconazole  (NIZORAL ) 2 % cream, Apply to the feet QHS and apply to the face QD on Monday, Wednesday, and Friday. (Patient not taking: Reported on 11/09/2024), Disp: 60 g, Rfl: 11   methylPREDNISolone  (MEDROL  DOSEPAK) 4 MG TBPK tablet, Use as directed., Disp: 21 each, Rfl: 0   mometasone  (ELOCON ) 0.1 % cream, Apply 1 Application topically daily as needed (Rash). (Patient not taking: Reported on 11/09/2024), Disp: 45 g, Rfl: 1   Omega-3 Fatty Acids (FISH OIL) 1000 MG CAPS, Take 1,000 mg by mouth daily. , Disp: , Rfl:    Turmeric 500 MG CAPS, Take 500 mg by mouth daily. , Disp: , Rfl:

## 2025-01-05 ENCOUNTER — Ambulatory Visit: Attending: Orthopedic Surgery | Admitting: Physical Therapy

## 2025-01-05 ENCOUNTER — Other Ambulatory Visit: Payer: Self-pay | Admitting: Urology

## 2025-01-05 DIAGNOSIS — M25612 Stiffness of left shoulder, not elsewhere classified: Secondary | ICD-10-CM | POA: Insufficient documentation

## 2025-01-05 DIAGNOSIS — M25512 Pain in left shoulder: Secondary | ICD-10-CM | POA: Insufficient documentation

## 2025-01-05 NOTE — Therapy (Signed)
 " OUTPATIENT PHYSICAL THERAPY SHOULDER TREATMENT  Patient Name: Martin Parks MRN: [id number] DOB:April 10, 1975, 50 y.o., male Today's Date: 01/05/2025  END OF SESSION:  PT End of Session - 01/05/25 1120     Visit Number 6    Number of Visits 8    Date for Recertification  01/04/25    PT Start Time 1105    PT Stop Time 1150    PT Time Calculation (min) 45 min    Activity Tolerance Patient tolerated treatment well    Behavior During Therapy [facility] for tasks assessed/performed            Past Medical History:  Diagnosis Date   Anxiety    Complication of anesthesia    hard time waking up after hernia surgery   Depression    Dysplastic nevus 04/19/2014   Right superior medial mid back. Mild, edges involved. Excised 05/10/2014, no residual DN.   Dysplastic nevus 03/31/2018   Medial scapular back. Moderate, edges free. Excised 06/09/2018, no residual DN.   GERD (gastroesophageal reflux disease)    h/o    Headache    migraines   History of kidney stones    currently   Hypertension    h/o years ago due to anxiety-taken off bp meds once anxiety meds started helping   Pneumonia    h/o    Umbilical hernia without obstruction and without gangrene 12/22/2018   Past Surgical History:  Procedure Laterality Date   COLONOSCOPY WITH PROPOFOL  N/A 03/30/2022   Procedure: COLONOSCOPY WITH PROPOFOL ;  Surgeon: Therisa Bi, MD;  Location: [facility] ENDOSCOPY;  Service: Gastroenterology;  Laterality: N/A;   HERNIA REPAIR     inguinal   TONSILLECTOMY     UMBILICAL HERNIA REPAIR N/A 03/02/2019   Procedure: OPEN HERNIA REPAIR UMBILICAL ADULT WITH MESH;  Surgeon: Nicholaus Selinda Birmingham, MD;  Location: [facility] ORS;  Service: General;  Laterality: N/A;   Patient Active Problem List   Diagnosis Date Noted   Contact dermatitis 05/25/2024   Rash and nonspecific skin eruption 05/25/2024   Viral URI with cough 05/25/2024   Class 2 severe obesity with serious comorbidity and body mass index (BMI) of 35.0 to 35.9 in  adult 02/09/2021   OSA on CPAP 06/05/2020   Prediabetes 02/10/2020   Hyperactivity 12/22/2018   Generalized anxiety disorder 12/22/2018   Hyperlipidemia LDL goal <100 12/22/2018   REFERRING PROVIDER: Jerel Madrid MD  REFERRING DIAG: Impingement of left shoulder, adhesive capsulitis.  THERAPY DIAG:  Acute pain of left shoulder  Stiffness of left shoulder, not elsewhere classified  Rationale for Evaluation and Treatment: Rehabilitation  ONSET DATE: ~2-3 months ago.  SUBJECTIVE:  SUBJECTIVE STATEMENT: Patient reports he better since starting PT but not as good as right.   PERTINENT HISTORY: See above.    PAIN:  Are you having pain? Yes: NPRS scale: 5-6/10. Pain location: Left shoulder. Pain description: Ache, throb, sharp.   Aggravating factors: Movement. Relieving factors: Rest.    PRECAUTIONS: None  RED FLAGS: None   WEIGHT BEARING RESTRICTIONS: No  FALLS:  Has patient fallen in last 6 months? No  LIVING ENVIRONMENT: Lives in: House/apartment Has following equipment at home: None  PLOF: Independent  PATIENT GOALS:Less pain and get back to weight lifting.    NEXT MD VISIT:   OBJECTIVE:   PATIENT SURVEYS:  QUICKDASH:  32 points (47.7%).  33 points (01/05/25).    POSTURE: Rounded shoulders.  UPPER EXTREMITY ROM:   Left shoulder active antigravity flexion to 155 degrees (right is 180 degrees), ER is 80 degrees and behind back to L4.    UPPER EXTREMITY MMT:  Normal left shoulder strength.    SHOULDER SPECIAL TESTS: Some pain reproduction with a left shoulder Impingement test.  Normal UE DTR's.  PALPATION:  Palpable pain in left acromial ridge region.                                                                                                                                TREATMENT DATE:   12/14/24: UBE at 90 RPM's x 10 minutes f/b UE Ranger on wall x 5 minutes f/b pulleys x 6 minutes.  Green theraband IR/ER to fatigue f/b PROM/stretching in supine x 14 minutes.                                       EXERCISE LOG  Exercise Repetitions and Resistance Comments  UBE 10 minutes   Pulleys 5 minutes   UE Ranger on wall 5 minutes   Shoulder abd (< 90 degrees) 3#, 2 sets of 15.   Full can 3#, 2 sets of 15.   Shoulder flex 3#, 2 sets of 15.   Biceps curls 7# 2 sets of 15   In supine:  1-1 stretching to patient's left shoulder x 10 minutes.    11/30/24:                                     EXERCISE LOG  Exercise Repetitions and Resistance Comments  UBE @90  RPM's x 10 minutes   Pulleys 5 minutes   UE Ranger on wall 5 minutes   RW4 Green theraband to fatigue.       STW/M x 14 minutes to patient's left shoulder posterior cuff musculature, middle and anterior deltoid.     11/23/24:  EXERCISE LOG       LT shldr  Exercise Repetitions and Resistance Comments  UBE 10 minutes  60 RPMs   Pulleys 5 minutes   UE ranger on wall 5  minutes   IR/ER Green  theraband  to fatigue each way   SDLY ER      Ts and Ys Green 2x 15 each   In supine:  PROM/stretching to left shoulder  ER and IR with end-range holds   PATIENT EDUCATION: Education details: RW85 Person educated: Patient Education method: handout Education comprehension: Excellent.  HOME EXERCISE PROGRAM: As above.    ASSESSMENT:  CLINICAL IMPRESSION: See below.    OBJECTIVE IMPAIRMENTS: decreased activity tolerance, decreased ROM, increased muscle spasms, and pain.   ACTIVITY LIMITATIONS: carrying, lifting, reach over head, and reach behind back.  PARTICIPATION LIMITATIONS: cleaning  PERSONAL FACTORS: Time since onset of injury/illness/exacerbation are also affecting patient's functional outcome.   REHAB POTENTIAL: Excellent  CLINICAL DECISION  MAKING: Stable/uncomplicated  EVALUATION COMPLEXITY: Low   GOALS:  SHORT TERM GOALS: Target date: 11/23/24  Ind with an initial HEP. Goal status: MET.   LONG TERM GOALS: Target date: 01/04/25  Ind with an advanced HEP.  Goal status: MET.  2.  Active left shoulder flexion to 165 degrees so the patient can easily reach overhead. Baseline:  Goal status: NM 160 degrees (01/05/25).  3.  Active ER to 90 degrees+ to allow for easily donning/doffing of apparel.  Goal status: NM.  85 degrees (01/05/25).  4.  Increase ROM so patient is able to reach behind back to L1.  Goal status: MET (01/05/25).  5.  Improve QUICKDASH score by at least 5 points.  Goal status: NM (re-taken 01/05/25).    6.  Perform ADL's with pain not > 2-3/10.  Goal status: NM.   PLAN:  PT FREQUENCY/DURATION:  8 visits.    PLANNED INTERVENTIONS: 97110-Therapeutic exercises, 97530- Therapeutic activity, [id number]- Neuromuscular re-education, 97535- Self Care, 02859- Manual therapy, G0283- Electrical stimulation (unattended), 97016- Vasopneumatic device, [id number]- Ultrasound, 79439 (1-2 muscles), 20561 (3+ muscles)- Dry Needling, Patient/Family education, Cryotherapy, and Moist heat  PLAN FOR NEXT SESSION: UBE, UE Ranger, PRE's, PROM.  Modalities as needed.    PHYSICAL THERAPY DISCHARGE SUMMARY  Visits from Start of Care: 6.  Current functional level related to goals / functional outcomes: See goal section.     Remaining deficits: Patient with continued pain with ADL performance.     Education / Equipment: HEP.     Patient agrees to discharge. Patient goals were partially met. Patient is being discharged due to completing course of PT.  The patient was highly motivated over the course of his PT.  He states his left shoulder definitely improved but most goals remain unmet. He is compliant with a HEP.  He has a pulley system and theraband for strengthening.  He also has a TENS unit and received massage therapy which is  helpful .  Eustolia Drennen, PT 01/05/2025, 12:09 PM  "

## 2025-02-26 ENCOUNTER — Encounter: Payer: BC Managed Care – PPO | Admitting: Family Medicine

## 2025-06-17 ENCOUNTER — Ambulatory Visit: Admitting: Behavioral Health

## 2025-09-16 ENCOUNTER — Ambulatory Visit: Admitting: Dermatology

## 2025-10-22 ENCOUNTER — Ambulatory Visit: Admitting: Urology
